# Patient Record
Sex: Female | Born: 1940
Health system: Southern US, Community
[De-identification: ages and names within clinical notes are randomized; demographics above are authoritative.]

## PROBLEM LIST (undated history)

## (undated) DIAGNOSIS — K219 Gastro-esophageal reflux disease without esophagitis: Secondary | ICD-10-CM

## (undated) DIAGNOSIS — E7800 Pure hypercholesterolemia, unspecified: Secondary | ICD-10-CM

## (undated) DIAGNOSIS — I639 Cerebral infarction, unspecified: Secondary | ICD-10-CM

## (undated) DIAGNOSIS — I10 Essential (primary) hypertension: Secondary | ICD-10-CM

## (undated) DIAGNOSIS — N390 Urinary tract infection, site not specified: Secondary | ICD-10-CM

## (undated) DIAGNOSIS — M8580 Other specified disorders of bone density and structure, unspecified site: Secondary | ICD-10-CM

## (undated) DIAGNOSIS — B019 Varicella without complication: Secondary | ICD-10-CM

## (undated) HISTORY — DX: Cerebral infarction, unspecified: I63.9

## (undated) HISTORY — DX: Varicella without complication: B01.9

## (undated) HISTORY — DX: Essential (primary) hypertension: I10

## (undated) HISTORY — DX: Gastro-esophageal reflux disease without esophagitis: K21.9

## (undated) HISTORY — PX: ABDOMINAL HYSTERECTOMY: SHX81

## (undated) HISTORY — DX: Urinary tract infection, site not specified: N39.0

## (undated) HISTORY — DX: Other specified disorders of bone density and structure, unspecified site: M85.80

## (undated) HISTORY — DX: Pure hypercholesterolemia, unspecified: E78.00

---

## 1974-05-04 HISTORY — PX: ABDOMINAL HYSTERECTOMY: SHX81

## 1984-05-04 HISTORY — PX: BREAST BIOPSY: SHX20

## 2007-03-28 ENCOUNTER — Ambulatory Visit: Payer: Self-pay | Admitting: Internal Medicine

## 2007-04-06 ENCOUNTER — Ambulatory Visit: Payer: Self-pay | Admitting: Family Medicine

## 2008-05-24 ENCOUNTER — Ambulatory Visit: Payer: Self-pay

## 2009-05-27 ENCOUNTER — Ambulatory Visit: Payer: Self-pay | Admitting: Internal Medicine

## 2009-10-10 ENCOUNTER — Ambulatory Visit: Payer: Self-pay | Admitting: Internal Medicine

## 2010-05-29 ENCOUNTER — Ambulatory Visit: Payer: Self-pay | Admitting: Unknown Physician Specialty

## 2011-06-02 ENCOUNTER — Ambulatory Visit: Payer: Self-pay

## 2014-04-05 ENCOUNTER — Ambulatory Visit: Payer: Self-pay | Admitting: Obstetrics and Gynecology

## 2015-06-11 ENCOUNTER — Other Ambulatory Visit: Payer: Self-pay | Admitting: Obstetrics and Gynecology

## 2015-06-11 DIAGNOSIS — Z1231 Encounter for screening mammogram for malignant neoplasm of breast: Secondary | ICD-10-CM

## 2015-06-13 ENCOUNTER — Ambulatory Visit
Admission: RE | Admit: 2015-06-13 | Discharge: 2015-06-13 | Disposition: A | Discharge status: Home / Self Care | Payer: Medicare Other | Source: Ambulatory Visit | Attending: Obstetrics and Gynecology | Admitting: Obstetrics and Gynecology

## 2015-06-13 DIAGNOSIS — Z1231 Encounter for screening mammogram for malignant neoplasm of breast: Secondary | ICD-10-CM | POA: Diagnosis not present

## 2015-06-17 ENCOUNTER — Other Ambulatory Visit: Payer: Self-pay | Admitting: Obstetrics and Gynecology

## 2015-06-17 DIAGNOSIS — R928 Other abnormal and inconclusive findings on diagnostic imaging of breast: Secondary | ICD-10-CM

## 2015-06-21 ENCOUNTER — Ambulatory Visit
Admission: RE | Admit: 2015-06-21 | Discharge: 2015-06-21 | Disposition: A | Discharge status: Home / Self Care | Payer: Medicare Other | Source: Ambulatory Visit | Attending: Obstetrics and Gynecology | Admitting: Obstetrics and Gynecology

## 2015-06-21 DIAGNOSIS — R928 Other abnormal and inconclusive findings on diagnostic imaging of breast: Secondary | ICD-10-CM

## 2016-07-07 ENCOUNTER — Other Ambulatory Visit: Payer: Self-pay | Admitting: Obstetrics and Gynecology

## 2016-07-07 DIAGNOSIS — Z1231 Encounter for screening mammogram for malignant neoplasm of breast: Secondary | ICD-10-CM

## 2016-07-15 ENCOUNTER — Ambulatory Visit
Admission: RE | Admit: 2016-07-15 | Discharge: 2016-07-15 | Disposition: A | Discharge status: Home / Self Care | Payer: Medicare Other | Source: Ambulatory Visit | Attending: Obstetrics and Gynecology | Admitting: Obstetrics and Gynecology

## 2016-07-15 DIAGNOSIS — Z1231 Encounter for screening mammogram for malignant neoplasm of breast: Secondary | ICD-10-CM | POA: Insufficient documentation

## 2017-08-11 ENCOUNTER — Ambulatory Visit (INDEPENDENT_AMBULATORY_CARE_PROVIDER_SITE_OTHER): Payer: Medicare Other

## 2017-08-11 ENCOUNTER — Encounter: Payer: Self-pay | Admitting: Podiatry

## 2017-08-11 ENCOUNTER — Ambulatory Visit (INDEPENDENT_AMBULATORY_CARE_PROVIDER_SITE_OTHER): Payer: Medicare Other | Admitting: Podiatry

## 2017-08-11 DIAGNOSIS — Q828 Other specified congenital malformations of skin: Secondary | ICD-10-CM

## 2017-08-11 DIAGNOSIS — M779 Enthesopathy, unspecified: Secondary | ICD-10-CM

## 2017-08-11 DIAGNOSIS — M778 Other enthesopathies, not elsewhere classified: Secondary | ICD-10-CM

## 2017-08-11 DIAGNOSIS — M7751 Other enthesopathy of right foot: Secondary | ICD-10-CM

## 2017-08-11 DIAGNOSIS — S90851A Superficial foreign body, right foot, initial encounter: Secondary | ICD-10-CM

## 2017-08-11 NOTE — Progress Notes (Signed)
  Subjective:  Patient ID: Alexa Ingram, female    DOB: 02/24/1941,  MRN: [id number] HPI Chief Complaint  Patient presents with  . Foot Pain    Lateral 5th MPJ right - callused area and tenderness x 1 week, thought she had something in foot, but doesn't remember stepping on anything, tried soaking and rubbing alcohol and vinegar on it-no help  . New Patient (Initial Visit)    77 y.o. female presents with the above complaint.   ROS: Denies fever chills nausea vomiting muscle aches pains chest pain shortness of breath calf pain headache  No past medical history on file. Past Surgical History:  Procedure Laterality Date  . ABDOMINAL HYSTERECTOMY    . BREAST BIOPSY Right 1986   neg   No current outpatient medications on file.  Allergies  Allergen Reactions  . Sulfa Antibiotics Itching  . Penicillin G Rash   Review of Systems Objective:  There were no vitals filed for this visit.  General: Well developed, nourished, in no acute distress, alert and oriented x3   Dermatological: Skin is warm, dry and supple bilateral. Nails x 10 are well maintained; remaining integument appears unremarkable at this time. There are no open sores, no preulcerative lesions, no rash or signs of infection present.  Reactive hyperkeratotic wound with porokeratotic lesion centrally located.  No foreign body visualized.  Vascular: Dorsalis Pedis artery and Posterior Tibial artery pedal pulses are 2/4 bilateral with immedate capillary fill time. Pedal hair growth present. No varicosities and no lower extremity edema present bilateral.   Neruologic: Grossly intact via light touch bilateral. Vibratory intact via tuning fork bilateral. Protective threshold with Semmes Wienstein monofilament intact to all pedal sites bilateral. Patellar and Achilles deep tendon reflexes 2+ bilateral. No Babinski or clonus noted bilateral.   Musculoskeletal: No gross boney pedal deformities bilateral. No pain, crepitus, or  limitation noted with foot and ankle range of motion bilateral. Muscular strength 5/5 in all groups tested bilateral.  Tailor's bunion deformity.  Bursitis and capsulitis is present fifth metatarsal phalangeal joint palpable.  Gait: Unassisted, Nonantalgic.    Radiographs:  Radiographs taken today demonstrate no acute findings  Assessment & Plan:   Assessment: Tailor's bunion deformity with capsulitis of the fifth metatarsal phalangeal joint and a punctated porokeratotic lesion right foot.    Plan: Injected 2 mg of dexamethasone after local anesthetic and sterile Betadine skin prep.  Tolerated procedure well.  Injected this to the fifth metatarsophalangeal joint plantarly.  I also debrided the reactive hyperkeratotic lesion that was present.  This alleviated the symptoms obviously with the local anesthetic should this recur she will notify us immediately.     Max T. [place] AllenHyatt, [place] Dakota[place]PM

## 2017-08-13 ENCOUNTER — Ambulatory Visit: Payer: Self-pay | Admitting: Podiatry

## 2018-01-08 ENCOUNTER — Ambulatory Visit
Admission: EM | Admit: 2018-01-08 | Discharge: 2018-01-08 | Disposition: A | Discharge status: Home / Self Care | Payer: Medicare Other | Attending: Family Medicine | Admitting: Family Medicine

## 2018-01-08 DIAGNOSIS — H00011 Hordeolum externum right upper eyelid: Secondary | ICD-10-CM

## 2018-01-08 DIAGNOSIS — I160 Hypertensive urgency: Secondary | ICD-10-CM | POA: Diagnosis not present

## 2018-01-08 MED ORDER — AMLODIPINE BESYLATE 10 MG PO TABS: 10.0000 mg | ORAL_TABLET | Freq: Every day | ORAL | 1 refills | Status: DC

## 2018-01-08 MED ORDER — POLYMYXIN B-TRIMETHOPRIM 10000-0.1 UNIT/ML-% OP SOLN: 1.0000 [drp] | Freq: Four times a day (QID) | OPHTHALMIC | 0 refills | Status: AC

## 2018-01-08 NOTE — Discharge Instructions (Signed)
Warm compresses frequently.  Antibiotic eye drop.  BP medication as prescribed.  Please follow up with a primary care provider.  Check BP daily.  Dr. Adriana Simas

## 2018-01-08 NOTE — ED Triage Notes (Signed)
Pt here for stye on her right eyelid for 2 days, has been doing the warm compresses. And still no improvement. Does have a slight pain in the ride corner of her eye.

## 2018-01-08 NOTE — ED Provider Notes (Signed)
[facility]    CSN: [id number] Arrival date & time: 01/08/18  1350  History   Chief Complaint Chief Complaint  Patient presents with  . Stye   HPI  77 year old female presents with the above complaint.  Patient reports a 2-day history of right upper eyelid swelling.  Mild pain.  Has been doing warm compresses without resolution.  No redness or drainage from the eye.  No vision changes.  No known inciting event.  No known exacerbating factors.  No other complaints.  Of note, patient is not taking her blood pressure medication.  Her blood pressure is markedly elevated today.  PMH, Surgical Hx, Family Hx, Social History reviewed and updated as below.  PMH: HTN  Past Surgical History:  Procedure Laterality Date  . ABDOMINAL HYSTERECTOMY    . BREAST BIOPSY Right 1986   neg    OB History   None    Home Medications    Prior to Admission medications   Medication Sig Start Date End Date Taking? Authorizing Provider  amLODipine (NORVASC) 10 MG tablet Take 1 tablet (10 mg total) by mouth daily. 01/08/18   Tommie Sams, DO  trimethoprim-polymyxin b (POLYTRIM) ophthalmic solution Place 1 drop into the right eye every 6 (six) hours for 5 days. 01/08/18 01/13/18  Tommie Sams, DO    Family History Family History  Problem Relation Age of Onset  . Breast cancer Sister 32  . Breast cancer Paternal Aunt 52    Social History Social History   Tobacco Use  . Smoking status: Unknown If Ever Smoked  . Smokeless tobacco: Never Used  Substance Use Topics  . Alcohol use: Not on file  . Drug use: Not on file     Allergies   Sulfa antibiotics and Penicillin g   Review of Systems Review of Systems  Eyes: Positive for pain.  Respiratory: Negative.   Cardiovascular: Negative.    Physical Exam Triage Vital Signs ED Triage Vitals  Enc Vitals Group     BP 01/08/18 1405 (!) 260/110     Pulse Rate 01/08/18 1405 87     Resp 01/08/18 1405 18     Temp 01/08/18 1405 98.3  F (36.8 C)     Temp Source 01/08/18 1405 Oral     SpO2 01/08/18 1405 98 %     Weight 01/08/18 1412 114 lb (51.7 kg)     Height --      Head Circumference --      Peak Flow --      Pain Score 01/08/18 1411 5     Pain Loc --      Pain Edu? --      Excl. in GC? --    Updated Vital Signs BP (!) 230/120 (BP Location: Left Arm)   Pulse 87   Temp 98.3 F (36.8 C) (Oral)   Resp 18   Wt 51.7 kg   SpO2 98%   Visual Acuity Right Eye Distance:   Left Eye Distance:   Bilateral Distance:    Right Eye Near:   Left Eye Near:    Bilateral Near:     Physical Exam  Constitutional: She is oriented to person, place, and time. She appears well-developed. No distress.  Eyes:  Right upper eyelid redness and swelling.  Normal conjunctiva.  Cardiovascular: Normal rate and regular rhythm.  Pulmonary/Chest: Effort normal. She has no wheezes. She has no rales.  Neurological: She is alert and oriented to person, place, and time.  Psychiatric: She has a normal mood and affect. Her behavior is normal.  Nursing note and vitals reviewed.  UC Treatments / Results  Labs (all labs ordered are listed, but only abnormal results are displayed) Labs Reviewed - No data to display  EKG None  Radiology No results found.  Procedures Procedures (including critical care time)  Medications Ordered in UC Medications - No data to display  Initial Impression / Assessment and Plan / UC Course  I have reviewed the triage vital signs and the nursing notes.  Pertinent labs & imaging results that were available during my care of the patient were reviewed by me and considered in my medical decision making (see chart for details).    77 year old female presents with a stye.  Patient also has hypertensive urgency.  Restarting amlodipine.  Warm compresses and Polytrim for her stye.  Final Clinical Impressions(s) / UC Diagnoses   Final diagnoses:  Hordeolum externum of right upper eyelid  Hypertensive  urgency     Discharge Instructions     Warm compresses frequently.  Antibiotic eye drop.  BP medication as prescribed.  Please follow up with a primary care provider.  Check BP daily.  Dr. Adriana Simas     ED Prescriptions    Medication Sig Dispense Auth. Provider   amLODipine (NORVASC) 10 MG tablet Take 1 tablet (10 mg total) by mouth daily. 90 tablet Marca Gadsby, [place] G, DO   trimethoprim-polymyxin b (POLYTRIM) ophthalmic solution Place 1 drop into the right eye every 6 (six) hours for 5 days. 10 mL Tommie Sams, DO     Controlled Substance Prescriptions [place] Controlled Substance Registry consulted? Not Applicable   Tommie Sams, DO 01/08/18 1636

## 2018-01-09 ENCOUNTER — Telehealth: Payer: Self-pay

## 2018-01-09 NOTE — Telephone Encounter (Signed)
Pt to urgent care with eye drops from Dr. Adriana Simas yesterday. She is afraid to take it due to allergy to sulfa. Ovid Curd prescribed Erythromycin ointment to replace the drops. I called prescription to [facility] and instructed patient.

## 2018-04-14 ENCOUNTER — Other Ambulatory Visit: Payer: Self-pay

## 2018-04-14 ENCOUNTER — Ambulatory Visit (INDEPENDENT_AMBULATORY_CARE_PROVIDER_SITE_OTHER): Payer: Medicare Other | Admitting: Nurse Practitioner

## 2018-04-14 ENCOUNTER — Encounter: Payer: Self-pay | Admitting: Nurse Practitioner

## 2018-04-14 VITALS — BP 135/82 | HR 100 | Temp 97.7°F | Ht <= 58 in | Wt 114.2 lb

## 2018-04-14 DIAGNOSIS — E785 Hyperlipidemia, unspecified: Secondary | ICD-10-CM

## 2018-04-14 DIAGNOSIS — Z7689 Persons encountering health services in other specified circumstances: Secondary | ICD-10-CM

## 2018-04-14 DIAGNOSIS — R0989 Other specified symptoms and signs involving the circulatory and respiratory systems: Secondary | ICD-10-CM | POA: Diagnosis not present

## 2018-04-14 DIAGNOSIS — I10 Essential (primary) hypertension: Secondary | ICD-10-CM | POA: Diagnosis not present

## 2018-04-14 MED ORDER — HYDROCHLOROTHIAZIDE 12.5 MG PO TABS: 12.5000 mg | ORAL_TABLET | Freq: Every day | ORAL | 3 refills | Status: DC

## 2018-04-14 MED ORDER — HYDROCHLOROTHIAZIDE 25 MG PO TABS: 25.0000 mg | ORAL_TABLET | Freq: Every day | ORAL | 2 refills | Status: DC

## 2018-04-14 NOTE — Progress Notes (Signed)
Subjective:    Patient ID: Alexa Ingram, female    DOB: [birth year], 77 y.o.   MRN: [id number]  Alexa Ingram is a 77 y.o. female presenting on 04/14/2018 for Establish Care (irregular blood pressure, pt notice some bilateral ankle swelling since taking Amlodipine )   HPI Establish Care New Provider Pt last seen by PCP Dr. Judie Grieve at [facility] in 2017-2018 and previously with Dr. Yves Dill.  Obtain records.  Hypertension Recently started amlodipine and has new leg swelling.  Has been on multiple medications for hypertension in past that cause side effects: Has been on carvedilol (red rash), valsartan (made her itch) in past.  Patient reports one medication caused abdominal swelling, but was not  Tekturna-aliskiren (unknown). - Lovaza (unknown) - VitD3 (unknown)  Social History   Tobacco Use  . Smoking status: Former Smoker    Last attempt to quit: 04/15/2003    Years since quitting: 15.0  . Smokeless tobacco: Never Used  . Tobacco comment: smoked socially most often for about 3-4 years  Substance Use Topics  . Alcohol use: Never    Frequency: Never  . Drug use: Never    Review of Systems Per HPI unless specifically indicated above     Objective:    BP 135/82 (BP Location: Left Arm, Patient Position: Sitting, Cuff Size: Normal)   Pulse 100   Temp 97.7 F (36.5 C) (Oral)   Ht 4' 8.5" (1.435 m)   Wt 114 lb 3.2 oz (51.8 kg)   BMI 25.15 kg/m   Wt Readings from Last 3 Encounters:  04/14/18 114 lb 3.2 oz (51.8 kg)  01/08/18 114 lb (51.7 kg)    Physical Exam Vitals signs reviewed.  Constitutional:      General: She is not in acute distress.    Appearance: She is well-developed.  HENT:     Head: Normocephalic and atraumatic.  Cardiovascular:     Rate and Rhythm: Normal rate and regular rhythm.     Pulses:          Carotid pulses are on the left side with bruit.    Heart sounds: Normal heart sounds, S1 normal and S2 normal.   Comments: JVD with hepatojugular reflux Pulmonary:     Effort: Pulmonary effort is normal. No respiratory distress.     Breath sounds: Normal breath sounds.  Skin:    General: Skin is warm and dry.  Neurological:     Mental Status: She is alert and oriented to person, place, and time.  Psychiatric:        Behavior: Behavior normal.       Assessment & Plan:   Problem List Items Addressed This Visit    None    Visit Diagnoses    Hyperlipidemia, unspecified hyperlipidemia type    -  Primary Patient with known history of hyperlipidemia.  NO recent labs and not currently on medications.  New LEFT carotid bruit 2/6 today on exam.   - Labs fasting in next 7 days   - likely will need to start statin therapy - US duplex to eval bruit. - Follow-up 3 months.   Relevant Medications   hydrochlorothiazide (HYDRODIURIL) 25 MG tablet   Other Relevant Orders   Comprehensive metabolic panel   Lipid panel   US Carotid Duplex Bilateral   Encounter to establish care     Previous PCP visit was > 6 months ago.  Recent urgent care visit with BP med management.  Records will be requested.  Past medical, family, and surgical history reviewed w/ pt.     Essential hypertension     Stable today, but patient with symptoms as side effects of amlodipine.   Plan: 1. Labs today 2. STOP amlodipine 3. START HCTZ 25 mg once daily 4. Follow-up 6 weeks.   Relevant Medications   hydrochlorothiazide (HYDRODIURIL) 25 MG tablet   Other Relevant Orders   Comprehensive metabolic panel   Bruit of left carotid artery     See AP hyperlipidemia above.   Relevant Orders   US Carotid Duplex Bilateral      Meds ordered this encounter  Medications  . DISCONTD: hydrochlorothiazide (HYDRODIURIL) 12.5 MG tablet    Sig: Take 1 tablet (12.5 mg total) by mouth daily.    Dispense:  90 tablet    Refill:  3    Order Specific Question:   Supervising Provider    Answer:   Smitty CordsKARAMALEGOS, ALEXANDER J [[id number]]  .  hydrochlorothiazide (HYDRODIURIL) 25 MG tablet    Sig: Take 1 tablet (25 mg total) by mouth daily.    Dispense:  30 tablet    Refill:  2    NOTE DOSE - PATIENT needs to take 25 mg once daily not 12.5 mg once daily.    Order Specific Question:   Supervising Provider    Answer:   Smitty CordsKARAMALEGOS, ALEXANDER J [[id number]]    Follow up plan: Return in about 6 weeks (around 05/26/2018) for hypertension.  Wilhelmina McardleLauren Lynda Capistran, DNP, AGPCNP-BC Adult Gerontology Primary Care Nurse Practitioner [facility] 04/14/2018, 3:41 PM

## 2018-04-14 NOTE — Patient Instructions (Addendum)
Alexa Ingram,   Thank you for coming in to clinic today.  1. STOP amlodipine   2. START hydrochlorothiazide 25 mg once daily - This will make you go to the bathroom more frequently to urinate. - Take this in the mornings.  Please schedule a follow-up appointment with [name], AGNP. Return in about 6 weeks (around 05/26/2018) for hypertension.  If you have any other questions or concerns, please feel free to call the clinic or send a message through MyChart. You may also schedule an earlier appointment if necessary.  You will receive a survey after today's visit either digitally by e-mail or paper by [employer] mail. Your experiences and feedback matter to us.  Please respond so we know how we are doing as we provide care for you.   [name], DNP, AGNP-BC Adult Gerontology Nurse Practitioner [facility]

## 2018-04-17 ENCOUNTER — Encounter: Payer: Self-pay | Admitting: Nurse Practitioner

## 2018-04-18 DIAGNOSIS — I10 Essential (primary) hypertension: Secondary | ICD-10-CM | POA: Diagnosis not present

## 2018-04-18 DIAGNOSIS — E785 Hyperlipidemia, unspecified: Secondary | ICD-10-CM | POA: Diagnosis not present

## 2018-04-19 ENCOUNTER — Encounter: Payer: Self-pay | Admitting: Nurse Practitioner

## 2018-04-19 DIAGNOSIS — I10 Essential (primary) hypertension: Secondary | ICD-10-CM | POA: Insufficient documentation

## 2018-04-19 DIAGNOSIS — M8580 Other specified disorders of bone density and structure, unspecified site: Secondary | ICD-10-CM | POA: Insufficient documentation

## 2018-04-19 DIAGNOSIS — E7800 Pure hypercholesterolemia, unspecified: Secondary | ICD-10-CM | POA: Insufficient documentation

## 2018-04-19 LAB — COMPREHENSIVE METABOLIC PANEL
ALT: 18 IU/L (ref 0–32)
AST: 18 IU/L (ref 0–40)
Albumin/Globulin Ratio: 1.9 (ref 1.2–2.2)
Albumin: 4.7 g/dL (ref 3.5–4.8)
Alkaline Phosphatase: 108 IU/L (ref 39–117)
BUN/Creatinine Ratio: 15 (ref 12–28)
BUN: 15 mg/dL (ref 8–27)
Bilirubin Total: 0.6 mg/dL (ref 0.0–1.2)
CO2: 23 mmol/L (ref 20–29)
Calcium: 9.7 mg/dL (ref 8.7–10.3)
Chloride: 106 mmol/L (ref 96–106)
Creatinine, Ser: 0.97 mg/dL (ref 0.57–1.00)
GFR calc Af Amer: 64 mL/min/{1.73_m2} (ref >59)
GFR calc non Af Amer: 56 mL/min/{1.73_m2} — ABNORMAL LOW (ref >59)
Globulin, Total: 2.5 g/dL (ref 1.5–4.5)
Glucose: 100 mg/dL — ABNORMAL HIGH (ref 65–99)
Potassium: 3.8 mmol/L (ref 3.5–5.2)
Sodium: 144 mmol/L (ref 134–144)
Total Protein: 7.2 g/dL (ref 6.0–8.5)

## 2018-04-19 LAB — LIPID PANEL
Chol/HDL Ratio: 4 ratio (ref 0.0–4.4)
Cholesterol, Total: 290 mg/dL — ABNORMAL HIGH (ref 100–199)
HDL: 73 mg/dL (ref >39)
LDL Calculated: 192 mg/dL — ABNORMAL HIGH (ref 0–99)
Triglycerides: 127 mg/dL (ref 0–149)
VLDL Cholesterol Cal: 25 mg/dL (ref 5–40)

## 2018-05-21 ENCOUNTER — Emergency Department
Admission: EM | Admit: 2018-05-21 | Discharge: 2018-05-21 | Disposition: A | Discharge status: Home / Self Care | Payer: Medicare Other | Attending: Emergency Medicine | Admitting: Emergency Medicine

## 2018-05-21 ENCOUNTER — Emergency Department: Payer: Medicare Other

## 2018-05-21 ENCOUNTER — Other Ambulatory Visit: Payer: Self-pay

## 2018-05-21 ENCOUNTER — Encounter: Payer: Self-pay | Admitting: Emergency Medicine

## 2018-05-21 DIAGNOSIS — Y998 Other external cause status: Secondary | ICD-10-CM | POA: Insufficient documentation

## 2018-05-21 DIAGNOSIS — Y9389 Activity, other specified: Secondary | ICD-10-CM | POA: Diagnosis not present

## 2018-05-21 DIAGNOSIS — I10 Essential (primary) hypertension: Secondary | ICD-10-CM | POA: Insufficient documentation

## 2018-05-21 DIAGNOSIS — Z79899 Other long term (current) drug therapy: Secondary | ICD-10-CM | POA: Diagnosis not present

## 2018-05-21 DIAGNOSIS — X58XXXA Exposure to other specified factors, initial encounter: Secondary | ICD-10-CM | POA: Insufficient documentation

## 2018-05-21 DIAGNOSIS — E876 Hypokalemia: Secondary | ICD-10-CM | POA: Diagnosis not present

## 2018-05-21 DIAGNOSIS — Y929 Unspecified place or not applicable: Secondary | ICD-10-CM | POA: Insufficient documentation

## 2018-05-21 DIAGNOSIS — S199XXA Unspecified injury of neck, initial encounter: Secondary | ICD-10-CM | POA: Diagnosis present

## 2018-05-21 DIAGNOSIS — R51 Headache: Secondary | ICD-10-CM | POA: Diagnosis not present

## 2018-05-21 DIAGNOSIS — Z87891 Personal history of nicotine dependence: Secondary | ICD-10-CM | POA: Insufficient documentation

## 2018-05-21 DIAGNOSIS — R000 Tachycardia, unspecified: Secondary | ICD-10-CM | POA: Diagnosis not present

## 2018-05-21 DIAGNOSIS — S161XXA Strain of muscle, fascia and tendon at neck level, initial encounter: Secondary | ICD-10-CM

## 2018-05-21 DIAGNOSIS — I6601 Occlusion and stenosis of right middle cerebral artery: Secondary | ICD-10-CM | POA: Diagnosis not present

## 2018-05-21 DIAGNOSIS — I6523 Occlusion and stenosis of bilateral carotid arteries: Secondary | ICD-10-CM | POA: Diagnosis not present

## 2018-05-21 LAB — CBC
HCT: 45.3 % (ref 36.0–46.0)
Hemoglobin: 15.6 g/dL — ABNORMAL HIGH (ref 12.0–15.0)
MCH: 30.5 pg (ref 26.0–34.0)
MCHC: 34.4 g/dL (ref 30.0–36.0)
MCV: 88.5 fL (ref 80.0–100.0)
Platelets: 261 10*3/uL (ref 150–400)
RBC: 5.12 MIL/uL — ABNORMAL HIGH (ref 3.87–5.11)
RDW: 12.5 % (ref 11.5–15.5)
WBC: 12.8 10*3/uL — ABNORMAL HIGH (ref 4.0–10.5)
nRBC: 0 % (ref 0.0–0.2)

## 2018-05-21 LAB — TROPONIN I: Troponin I: <0.03 ng/mL (ref <0.03)

## 2018-05-21 LAB — BASIC METABOLIC PANEL
Anion gap: 9 (ref 5–15)
BUN: 16 mg/dL (ref 8–23)
CO2: 31 mmol/L (ref 22–32)
Calcium: 9.7 mg/dL (ref 8.9–10.3)
Chloride: 98 mmol/L (ref 98–111)
Creatinine, Ser: 1.17 mg/dL — ABNORMAL HIGH (ref 0.44–1.00)
GFR calc Af Amer: 51 mL/min — ABNORMAL LOW (ref >60)
GFR calc non Af Amer: 44 mL/min — ABNORMAL LOW (ref >60)
Glucose, Bld: 157 mg/dL — ABNORMAL HIGH (ref 70–99)
Potassium: 2.7 mmol/L — CL (ref 3.5–5.1)
Sodium: 138 mmol/L (ref 135–145)

## 2018-05-21 MED ORDER — ACETAMINOPHEN 325 MG PO TABS
650.0000 mg | ORAL_TABLET | Freq: Once | ORAL | Status: AC
  Administered 2018-05-21: 650 mg via ORAL
  Filled 2018-05-21: qty 2

## 2018-05-21 MED ORDER — SODIUM CHLORIDE 0.9 % IV BOLUS
500.0000 mL | Freq: Once | INTRAVENOUS | Status: AC
  Administered 2018-05-21: 500 mL via INTRAVENOUS

## 2018-05-21 MED ORDER — AMLODIPINE BESYLATE 5 MG PO TABS
10.0000 mg | ORAL_TABLET | Freq: Once | ORAL | Status: AC
  Administered 2018-05-21: 10 mg via ORAL
  Filled 2018-05-21: qty 2

## 2018-05-21 MED ORDER — POTASSIUM CHLORIDE 20 MEQ/15ML (10%) PO SOLN
40.0000 meq | Freq: Once | ORAL | Status: AC
  Administered 2018-05-21: 40 meq via ORAL
  Filled 2018-05-21: qty 30

## 2018-05-21 MED ORDER — LABETALOL HCL 100 MG PO TABS
100.0000 mg | ORAL_TABLET | ORAL | Status: AC
  Administered 2018-05-21: 100 mg via ORAL
  Filled 2018-05-21: qty 1

## 2018-05-21 MED ORDER — AMLODIPINE BESYLATE 2.5 MG PO TABS: 2.5000 mg | ORAL_TABLET | Freq: Every day | ORAL | 0 refills | Status: DC

## 2018-05-21 MED ORDER — IOHEXOL 350 MG/ML SOLN
75.0000 mL | Freq: Once | INTRAVENOUS | Status: AC | PRN
  Administered 2018-05-21: 60 mL via INTRAVENOUS

## 2018-05-21 MED ORDER — IBUPROFEN 400 MG PO TABS
400.0000 mg | ORAL_TABLET | ORAL | Status: AC
  Administered 2018-05-21: 400 mg via ORAL
  Filled 2018-05-21: qty 1

## 2018-05-21 NOTE — ED Notes (Signed)
Pt ambulated without difficulty or assistance

## 2018-05-21 NOTE — ED Notes (Signed)
MD notified of BP at this time.

## 2018-05-21 NOTE — Discharge Instructions (Signed)
As we have discussed, please follow up with your primary care doctor as soon as possible regarding todays Emergency Department (ED) visit and your neck pain, high blood pressure, and noted low potassium.    Call your doctor or return to the ED if you have a worsening neck pain or headache, sudden and severe headache, confusion, slurred speech, facial droop, weakness or numbness in any arm or leg, extreme fatigue, vision problems, or other symptoms that concern you.

## 2018-05-21 NOTE — ED Triage Notes (Signed)
Pt to ED via POV c/o head and neck pain that started last night. Pt states that she was lying down when the pain started. Pt states that prior to that she had dropped her keys in the car between the seats and was struggling to get them out. Pt states that the pain is worse when she tries to turn her head. Pt denies any neuro symptoms. Pt is in NAD at this time.

## 2018-05-21 NOTE — ED Notes (Signed)
Patient transported to CT 

## 2018-05-21 NOTE — ED Provider Notes (Signed)
[facility] Emergency Department Provider Note   ____________________________________________   First MD Initiated Contact with Patient 05/21/18 [phone]     (approximate)  I have reviewed the triage vital signs and the nursing notes.   HISTORY  Chief Complaint Headache and Neck Pain    HPI Alexa AlimentKay Elizabeth Haverstock is a 78 y.o. female here for evaluation of neck pain  Patient reports last night she began having pain over the back right upper portion of her neck.  She points to the back of her neck, points just lateral to about C1-C2 region.  No injury.  She reports that hurt all night and kept her from sleeping.  Is associated with a headache that radiates out from that point over the back portion of the scalp.  No weakness numbness tingling nausea or vomiting.  No fevers or chills.  No chest pain or trouble breathing.  Reports he is otherwise in good health.  The pain started as discomfort when she had bent forward to pick up her car keys that had fallen between 2 seats, but it seems to be worsening.  She does not take any medication for it.  Patient is agreeable to trying Tylenol, she does not wish for anything stronger at this time, does not wish for anything like ibuprofen or anything stronger like morphine either.  She would just like to try Tylenol.  She does have high blood pressure, was previously on amlodipine but changed to hydrochlorothiazide as they thought she did not quite need as high dosing.   Past Medical History:  Diagnosis Date  . Hypertension   . Osteopenia   . Pure hypercholesterolemia     Patient Active Problem List   Diagnosis Date Noted  . Hypertension   . Osteopenia   . Pure hypercholesterolemia     Past Surgical History:  Procedure Laterality Date  . ABDOMINAL HYSTERECTOMY     ovaries remain  . BREAST BIOPSY Right 1986   neg    Prior to Admission medications   Medication Sig Start Date End Date Taking? Authorizing Provider    hydrochlorothiazide (HYDRODIURIL) 25 MG tablet Take 1 tablet (25 mg total) by mouth daily. 04/14/18  Yes Galen ManilaKennedy, Lauren Renee, NP  vitamin C (ASCORBIC ACID) 500 MG tablet Take 500 mg by mouth daily.   Yes [provider]  amLODipine (NORVASC) 2.5 MG tablet Take 1 tablet (2.5 mg total) by mouth daily. 05/21/18 05/21/19  Sharyn CreamerQuale, Mark, MD    Allergies Sulfa antibiotics; Sulfate; and Penicillin g  Family History  Problem Relation Age of Onset  . Breast cancer Sister 5160  . Stroke Sister   . Breast cancer Paternal Aunt 8990  . Stroke Mother   . Hypertension Mother   . Healthy Daughter   . Healthy Son     Social History Social History   Tobacco Use  . Smoking status: Former Smoker    Last attempt to quit: 04/15/2003    Years since quitting: 15.1  . Smokeless tobacco: Never Used  . Tobacco comment: smoked socially most often for about 3-4 years  Substance Use Topics  . Alcohol use: Never    Frequency: Never  . Drug use: Never    Review of Systems Constitutional: No fever/chills Eyes: No visual changes. ENT: No sore throat.  See HPI.  Does report that she has some known blockages in her neck previously. Cardiovascular: Denies chest pain. Respiratory: Denies shortness of breath. Gastrointestinal: No abdominal pain.   Genitourinary: Negative for  dysuria. Musculoskeletal: Negative for back pain. Skin: Negative for rash. Neurological: Negative for areas of focal weakness or numbness.  No trouble with vision.  No double vision.  The headache started last night, slowly worsened throughout the night.  Was not worst and sudden and abrupt in onset.    ____________________________________________   PHYSICAL EXAM:  VITAL SIGNS: ED Triage Vitals  Enc Vitals Group     BP 05/21/18 0857 (!) 194/98     Pulse Rate 05/21/18 0857 (!) 127     Resp 05/21/18 0857 16     Temp 05/21/18 0857 98.2 F (36.8 C)     Temp Source 05/21/18 0857 Oral     SpO2 05/21/18 0857 100 %      Weight 05/21/18 0858 114 lb (51.7 kg)     Height 05/21/18 0858 4\' 8"  (1.422 m)     Head Circumference --      Peak Flow --      Pain Score 05/21/18 0858 9     Pain Loc --      Pain Edu? --      Excl. in GC? --     Constitutional: Alert and oriented. Well appearing and in no acute distress. Eyes: Conjunctivae are normal. Head: Atraumatic. Nose: No congestion/rhinnorhea. Mouth/Throat: Mucous membranes are moist. Neck: No stridor.  Denies anterior neck pain. Patient able to move her head left and right through full range of motion, but reports pain in the back right upper portion of the cervical paraspinous region especially with turning head to the right.  There is no overlying mass abscess or induration in the area. Cardiovascular: Normal rate, regular rhythm. Grossly normal heart sounds.  Good peripheral circulation. Respiratory: Normal respiratory effort.  No retractions. Lungs CTAB. Gastrointestinal: Soft and nontender. No distention. Musculoskeletal: No lower extremity tenderness nor edema. Neurologic:  Normal speech and language. No gross focal neurologic deficits are appreciated.  Cranial nerve exam is normal. Skin:  Skin is warm, dry and intact. No rash noted. Psychiatric: Mood and affect are normal. Speech and behavior are normal.  ____________________________________________   LABS (all labs ordered are listed, but only abnormal results are displayed)  Labs Reviewed  CBC - Abnormal; Notable for the following components:      Result Value   WBC 12.8 (*)    RBC 5.12 (*)    Hemoglobin 15.6 (*)    All other components within normal limits  BASIC METABOLIC PANEL - Abnormal; Notable for the following components:   Potassium 2.7 (*)    Glucose, Bld 157 (*)    Creatinine, Ser 1.17 (*)    GFR calc non Af Amer 44 (*)    GFR calc Af Amer 51 (*)    All other components within normal limits  TROPONIN I   ____________________________________________  EKG  Reviewed by me at  9:30 AM Heart rate 129 QRS 95 QTc 440 Some slight motion artifact, sinus tachycardia, nonspecific T wave abnormality, no ST elevation.  May be rate related. ____________________________________________  RADIOLOGY  Ct Angio Head W Or Wo Contrast  Result Date: 05/21/2018 CLINICAL DATA:  Right upper cervical neck pain. Rule out dissection. Headache. EXAM: CT ANGIOGRAPHY HEAD AND NECK TECHNIQUE: Multidetector CT imaging of the head and neck was performed using the standard protocol during bolus administration of intravenous contrast. Multiplanar CT image reconstructions and MIPs were obtained to evaluate the vascular anatomy. Carotid stenosis measurements (when applicable) are obtained utilizing NASCET criteria, using the distal internal carotid diameter as the  denominator. CONTRAST:  60mL OMNIPAQUE IOHEXOL 350 MG/ML SOLN COMPARISON:  None. FINDINGS: CT HEAD FINDINGS Brain: No evidence of acute infarction, hemorrhage, hydrocephalus, extra-axial collection or mass lesion/mass effect. Vascular: Negative for hyperdense vessel Skull: Negative Sinuses: Negative Orbits: Negative Review of the MIP images confirms the above findings CTA NECK FINDINGS Aortic arch: Mild atherosclerotic calcification aortic arch and proximal great vessels. Right carotid system: Mild atherosclerotic calcification right common carotid artery. Calcified plaque proximal right internal carotid artery narrowing the lumen by 65% diameter stenosis. Left carotid system: Mild atherosclerotic disease left common carotid artery. Atherosclerotic calcification left carotid bifurcation. 25% diameter stenosis left internal carotid artery. Vertebral arteries: Small vertebral arteries bilaterally. Negative for stenosis or dissection. Skeleton: No acute abnormality. Other neck: Negative Upper chest: Negative Review of the MIP images confirms the above findings CTA HEAD FINDINGS Anterior circulation: Cavernous carotid widely patent bilaterally with minimal  atherosclerotic disease. Anterior and middle cerebral arteries patent bilaterally. Mild stenosis right MCA bifurcation. Posterior circulation: Fetal origin of the posterior cerebral arteries with hypoplastic vertebral arteries and basilar artery. Left vertebral artery ends in PICA. PICA patent bilaterally. Basilar patent. Posterior cerebral arteries patent bilaterally without stenosis. Venous sinuses: Normal enhancement. Anatomic variants: None Delayed phase: Normal enhancement postcontrast administration. Review of the MIP images confirms the above findings IMPRESSION: 1. Negative for carotid or vertebral dissection 2. 65% diameter stenosis proximal right internal carotid artery. 25% diameter stenosis proximal left internal carotid artery 3. Mild stenosis right MCA bifurcation 4. Negative for emergent large vessel occlusion. Electronically Signed   By: Marlan Palauharles  Clark M.D.   On: 05/21/2018 10:44   Ct Angio Neck W And/or Wo Contrast  Result Date: 05/21/2018 CLINICAL DATA:  Right upper cervical neck pain. Rule out dissection. Headache. EXAM: CT ANGIOGRAPHY HEAD AND NECK TECHNIQUE: Multidetector CT imaging of the head and neck was performed using the standard protocol during bolus administration of intravenous contrast. Multiplanar CT image reconstructions and MIPs were obtained to evaluate the vascular anatomy. Carotid stenosis measurements (when applicable) are obtained utilizing NASCET criteria, using the distal internal carotid diameter as the denominator. CONTRAST:  60mL OMNIPAQUE IOHEXOL 350 MG/ML SOLN COMPARISON:  None. FINDINGS: CT HEAD FINDINGS Brain: No evidence of acute infarction, hemorrhage, hydrocephalus, extra-axial collection or mass lesion/mass effect. Vascular: Negative for hyperdense vessel Skull: Negative Sinuses: Negative Orbits: Negative Review of the MIP images confirms the above findings CTA NECK FINDINGS Aortic arch: Mild atherosclerotic calcification aortic arch and proximal great vessels.  Right carotid system: Mild atherosclerotic calcification right common carotid artery. Calcified plaque proximal right internal carotid artery narrowing the lumen by 65% diameter stenosis. Left carotid system: Mild atherosclerotic disease left common carotid artery. Atherosclerotic calcification left carotid bifurcation. 25% diameter stenosis left internal carotid artery. Vertebral arteries: Small vertebral arteries bilaterally. Negative for stenosis or dissection. Skeleton: No acute abnormality. Other neck: Negative Upper chest: Negative Review of the MIP images confirms the above findings CTA HEAD FINDINGS Anterior circulation: Cavernous carotid widely patent bilaterally with minimal atherosclerotic disease. Anterior and middle cerebral arteries patent bilaterally. Mild stenosis right MCA bifurcation. Posterior circulation: Fetal origin of the posterior cerebral arteries with hypoplastic vertebral arteries and basilar artery. Left vertebral artery ends in PICA. PICA patent bilaterally. Basilar patent. Posterior cerebral arteries patent bilaterally without stenosis. Venous sinuses: Normal enhancement. Anatomic variants: None Delayed phase: Normal enhancement postcontrast administration. Review of the MIP images confirms the above findings IMPRESSION: 1. Negative for carotid or vertebral dissection 2. 65% diameter stenosis proximal right internal carotid artery. 25% diameter  stenosis proximal left internal carotid artery 3. Mild stenosis right MCA bifurcation 4. Negative for emergent large vessel occlusion. Electronically Signed   By: Marlan Palau M.D.   On: 05/21/2018 10:44    CT results reviewed by me.  Discussed narrowing in the carotid artery with the patient, she reports she is already aware and will discuss with her primary care doctor as well. ____________________________________________   PROCEDURES  Procedure(s) performed: None  Procedures  Critical Care performed:  No  ____________________________________________   INITIAL IMPRESSION / ASSESSMENT AND PLAN / ED COURSE  Pertinent labs & imaging results that were available during my care of the patient were reviewed by me and considered in my medical decision making (see chart for details).   Differential diagnosis includes but is not exclusive to subarachnoid hemorrhage, meningitis, encephalitis, previous head trauma, cavernous venous thrombosis, muscle tension headache, temporal arteritis, migraine or migraine equivalent, etc. evaluation, seems her pain is mostly limited to the cervical spine region, but also radiates up into the occipital region of the head.  I suspect this may be musculoskeletal in nature as there is point tenderness in the paraspinous muscle regions of the right cervical spine region.  No fall or injury.  Started after bending forward to get car keys.  Given the patient's clinical history, presentation no notable hypertension on arrival which may or may not be related to her pain will obtain CT angiogram to further evaluate especially exclude dissection, intracranial hemorrhage, or alternatively it appears to be very low risk presentation for aneurysm.  No cardiac or pulmonary symptoms.  Normal and very reassuring neurologic examination.   Clinical Course as of May 22 1415  Sat May 21, 2018  1034 Notable ongoing severe hypertension.  I have given Norvasc, no improvement.  Will trial oral labetalol at this time.   [MQ]  1145 Patient reports she is feeling better.  Pain is improved, just very mild right now.  Reports the Tylenol helped.  She is resting comfortably, fully alert and oriented.  CT scan reassuring, patient reports she is already aware of stenosis in the right carotid and she follows with her primary care doctor for that.  Her blood pressure has improved markedly, and came to continue to observe her and give her a small fluid bolus as I did not anticipate her blood pressure  dropping so rapidly.  She is fully alert and oriented without complaint other than slight neck discomfort at this time.  Rule replete potassium, takes hydrochlorothiazide which is likely causation for that.   [MQ]    Clinical Course User Index [MQ] Sharyn Creamer, MD    ----------------------------------------- 2:17 PM on 05/21/2018 -----------------------------------------  Patient feels well.  Reports she is ready to go home.  Feels much better.  Blood pressure well controlled, will place the patient on a low-dose amlodipine which she will start tomorrow and follow-up closely with her primary care doctor.  She is previously been on this medication in the past as well.  Careful return precautions regarding neck pain, headache reviewed with the patient is in agreement.  She will set up close follow-up with her primary care doctor for follow-up on today's evaluation.  Return precautions and treatment recommendations and follow-up discussed with the patient who is agreeable with the plan.  Ambulatory, feels well no complaints at present. ____________________________________________   FINAL CLINICAL IMPRESSION(S) / ED DIAGNOSES  Final diagnoses:  Strain of neck muscle, initial encounter  Hypokalemia  Hypertension, unspecified type  Note:  This document was prepared using Conservation officer, historic buildings and may include unintentional dictation errors       Sharyn Creamer, MD 05/21/18 1418

## 2018-05-23 ENCOUNTER — Telehealth: Payer: Self-pay | Admitting: Nurse Practitioner

## 2018-05-23 ENCOUNTER — Other Ambulatory Visit: Payer: Self-pay | Admitting: Family Medicine

## 2018-05-23 DIAGNOSIS — I10 Essential (primary) hypertension: Secondary | ICD-10-CM

## 2018-05-23 MED ORDER — HYDRALAZINE HCL 25 MG PO TABS: 25.0000 mg | ORAL_TABLET | Freq: Two times a day (BID) | ORAL | 0 refills | Status: DC

## 2018-05-23 NOTE — Telephone Encounter (Signed)
Pt said the hydrochlorothiazide is causing muscle cramps in neck [phone]

## 2018-05-23 NOTE — Telephone Encounter (Signed)
Already addressed in telephone encounter earlier today 05/23/18.  Saralyn Pilar, DO [facility] 05/23/2018, 6:03 PM

## 2018-05-23 NOTE — Telephone Encounter (Signed)
Covering inbox for Alexa McardleLauren Ingram, AGPCNP-BC while she is out of office.  I reviewed chart last seen by PCP Alexa 04/2018 from her note at that time this is a known problem with med allergies see copy below  Has been on multiple medications for hypertension in past that cause side effects: Has been on carvedilol (red rash), valsartan (made her itch) in past.  Patient reports one medication caused abdominal swelling, but was not  Tekturna-aliskiren (unknown).  Now she is allergic to HCTZ - First I would question her allergy to HCTZ, I understand she may be having side effects from it, but it may not be an actual allergy.  The only other BP medication that I can reasonably think of to try her on would be Hydralazine. She may take 1 pill twice a day to start, and in future if needed it could be increased to 3 times a day or dose increased. I will defer this to PCP at next visit and she may even warrant a 2nd opinion from specialist for further HTN management for 2nd opinion.  Ask her if she is familiar with Hydralazine - and if she agrees to try, I can send new rx today.  Saralyn PilarAlexander Lashaundra Lehrmann, DO [facility] 05/23/2018, 1:34 PM

## 2018-05-23 NOTE — Telephone Encounter (Signed)
Left message for patient to call back  

## 2018-05-23 NOTE — Telephone Encounter (Signed)
Called patient as requested, spoke with her, see message below.  She understands, has never been on Hydralazine, interested to try this option.  I sent rx Hydralazine 25mg  BID for now, advised likely may need future adjust dose if this is monotherapy could do 3 to 4 times a day vs inc dose if needed.  Start with lower dose and gradual increase due to her history of side effects. Reviewed side effect profile, common ones and rare such as lupus-like syndrome.  She has had some labile BP, but sometimes it is normal.  She will follow-up with PCP in 2 days on 05/25/18 to review further. I advised that she may adjust the dose and then in future she can consider referral to Cardiology as option for more of a 2nd opinion from specialist if still difficulty managing HTN.  Saralyn Pilar, DO [facility] 05/23/2018, 6:02 PM

## 2018-05-23 NOTE — Telephone Encounter (Signed)
Pt return you call requesting you call her before 6 [phone]

## 2018-05-23 NOTE — Telephone Encounter (Signed)
As per patient she is allergic to HCTZ causing neck pain and pain in the back side of her head went to ED yesterday. Also she is allergic to amlodipine which was Rx by ED yesterday. Please suggest ?

## 2018-05-25 ENCOUNTER — Other Ambulatory Visit: Payer: Self-pay | Admitting: Nurse Practitioner

## 2018-05-25 ENCOUNTER — Other Ambulatory Visit: Payer: Self-pay

## 2018-05-25 ENCOUNTER — Encounter: Payer: Self-pay | Admitting: Nurse Practitioner

## 2018-05-25 ENCOUNTER — Ambulatory Visit (INDEPENDENT_AMBULATORY_CARE_PROVIDER_SITE_OTHER): Payer: Medicare Other | Admitting: Nurse Practitioner

## 2018-05-25 VITALS — BP 191/83 | HR 95 | Temp 97.7°F | Ht <= 58 in | Wt 115.6 lb

## 2018-05-25 DIAGNOSIS — E876 Hypokalemia: Secondary | ICD-10-CM

## 2018-05-25 DIAGNOSIS — I10 Essential (primary) hypertension: Secondary | ICD-10-CM | POA: Diagnosis not present

## 2018-05-25 MED ORDER — POTASSIUM CHLORIDE CRYS ER 20 MEQ PO TBCR: 20.0000 meq | EXTENDED_RELEASE_TABLET | Freq: Every day | ORAL | 0 refills | Status: DC

## 2018-05-25 NOTE — Progress Notes (Signed)
Subjective:    Patient ID: Alexa Ingram, female    DOB: [birth year], 78 y.o.   MRN: [id number]  Scott Mallicoat is a 78 y.o. female presenting on 05/25/2018 for Hypertension (pt recently prescribed  Hydralazine 25mg  BID. She also was seen in the ER x 4 days ago and diagnose with srain muscle. The pt associates the pain with a reaction to blood pressure medication.)   HPI Hypertension - She is not checking BP at home or outside of clinic.    She had been admitted to ER 4 days ago with neck spasm and was also found to have hypokalemia.  Hypokalemia has not been treated other than stopping HCTZ.  Patient was then placed on hydralazine 25 mg bid and took her first dose today.  Feels slightly jittery. - She is not currently symptomatic. - Pt denies headache, lightheadedness, dizziness, changes in vision, chest tightness/pressure, palpitations, leg swelling, sudden loss of speech or loss of consciousness. - She  reports no regular exercise routine. - Her diet is moderate in salt, moderate in fat, and moderate in carbohydrates.   Social History   Tobacco Use  . Smoking status: Former Smoker    Last attempt to quit: 04/15/2003    Years since quitting: 15.1  . Smokeless tobacco: Never Used  . Tobacco comment: smoked socially most often for about 3-4 years  Substance Use Topics  . Alcohol use: Never    Frequency: Never  . Drug use: Never    Review of Systems Per HPI unless specifically indicated above     Objective:    BP (!) 191/83 (BP Location: Right Arm, Patient Position: Sitting, Cuff Size: Normal)   Pulse 95   Temp 97.7 F (36.5 C) (Oral)   Ht 4\' 8"  (1.422 m)   Wt 115 lb 9.6 oz (52.4 kg)   BMI 25.92 kg/m   Wt Readings from Last 3 Encounters:  05/25/18 115 lb 9.6 oz (52.4 kg)  05/21/18 114 lb (51.7 kg)  04/14/18 114 lb 3.2 oz (51.8 kg)    Physical Exam Vitals signs reviewed.  Constitutional:      General: She is not in acute distress.    Appearance: She is  well-developed.  HENT:     Head: Normocephalic and atraumatic.  Cardiovascular:     Rate and Rhythm: Normal rate and regular rhythm.     Pulses:          Radial pulses are 2+ on the right side and 2+ on the left side.       Posterior tibial pulses are 1+ on the right side and 1+ on the left side.     Heart sounds: Normal heart sounds, S1 normal and S2 normal.  Pulmonary:     Effort: Pulmonary effort is normal. No respiratory distress.     Breath sounds: Normal breath sounds and air entry.  Musculoskeletal:     Right lower leg: No edema.     Left lower leg: No edema.  Skin:    General: Skin is warm and dry.     Capillary Refill: Capillary refill takes less than 2 seconds.  Neurological:     Mental Status: She is alert and oriented to person, place, and time.  Psychiatric:        Attention and Perception: Attention normal.        Mood and Affect: Mood and affect normal.        Behavior: Behavior normal. Behavior is cooperative.  Results for orders placed or performed during the hospital encounter of 05/21/18  CBC  Result Value Ref Range   WBC 12.8 (H) 4.0 - 10.5 K/uL   RBC 5.12 (H) 3.87 - 5.11 MIL/uL   Hemoglobin 15.6 (H) 12.0 - 15.0 g/dL   HCT 16.145.3 09.636.0 - 04.546.0 %   MCV 88.5 80.0 - 100.0 fL   MCH 30.5 26.0 - 34.0 pg   MCHC 34.4 30.0 - 36.0 g/dL   RDW 40.912.5 81.111.5 - 91.415.5 %   Platelets 261 150 - 400 K/uL   nRBC 0.0 0.0 - 0.2 %  Basic metabolic panel  Result Value Ref Range   Sodium 138 135 - 145 mmol/L   Potassium 2.7 (LL) 3.5 - 5.1 mmol/L   Chloride 98 98 - 111 mmol/L   CO2 31 22 - 32 mmol/L   Glucose, Bld 157 (H) 70 - 99 mg/dL   BUN 16 8 - 23 mg/dL   Creatinine, Ser 7.821.17 (H) 0.44 - 1.00 mg/dL   Calcium 9.7 8.9 - 95.610.3 mg/dL   GFR calc non Af Amer 44 (L) >60 mL/min   GFR calc Af Amer 51 (L) >60 mL/min   Anion gap 9 5 - 15  Troponin I - Add-On to previous collection  Result Value Ref Range   Troponin I <0.03 <0.03 ng/mL      Assessment & Plan:   Problem List  Items Addressed This Visit      Cardiovascular and Mediastinum   Hypertension Uncontrolled hypertension.  BP goal < 140/90.  Pt is not working on lifestyle modifications.  Taking medications tolerating well without side effects. Patient has had no prior workup for renovascular hypertension.  Is increasingly difficult to manage due to patient's perceived allergies to medications which are in actuality mostly intolerances.  Plan: 1. Continue taking hydralazine.  INCREASE to 50 mg twice daily 2. Obtain labs in about 2 weeks for recheck of hypokalemia.  3. Encouraged heart healthy diet and increasing exercise to 30 minutes most days of the week. 4. Check BP 1-2 x per week at home, keep log, and bring to clinic at next appointment. 5. Renal artery us ordered 6. Referral placed to cardiology for hypertension management. 7. Follow up 1 months.     Relevant Orders   Ambulatory referral to Cardiology   Comprehensive metabolic panel   US Renal Artery Stenosis    Other Visit Diagnoses    Hypokalemia    -  Primary START taking potassium chloride 20 meq once daily for 7 days.  Then recheck labs.  Remain off HCTZ.  Follow-up prn after labs.   Relevant Medications   potassium chloride SA (K-DUR,KLOR-CON) 20 MEQ tablet   Other Relevant Orders   Comprehensive metabolic panel   US Renal Artery Stenosis      Meds ordered this encounter  Medications  . potassium chloride SA (K-DUR,KLOR-CON) 20 MEQ tablet    Sig: Take 1 tablet (20 mEq total) by mouth daily.    Dispense:  7 tablet    Refill:  0    Order Specific Question:   Supervising Provider    Answer:   Smitty CordsKARAMALEGOS, ALEXANDER J [[id number]]   Follow up plan: Return in about 4 weeks (around 06/22/2018) for hypertension.  A total of 25 minutes was spent face-to-face with this patient. Greater than 50% of this time was spent in counseling and coordination of care with the patient regarding hypertension disease education, medication instructions, review  of amlodipine to reconsider restarting, discussion  of renal impacts on BP and need for [place] testing, referral to Cardiology.    Wilhelmina Mcardle, DNP, AGPCNP-BC Adult Gerontology Primary Care Nurse Practitioner [facility] 05/25/2018, 12:00 PM

## 2018-05-25 NOTE — Patient Instructions (Addendum)
Alexa Ingram,   Thank you for coming in to clinic today.  1. Hydralazine 50 mg total twice daily.  Take two 25 mg tablets twice daily.  2. CARDIOLOGY [facility] [address]  INSIDE the [facility] at [facility]. [address] [place], [place] [address] Main: [phone]  Fax: [phone]  Please schedule a follow-up appointment with Wilhelmina McardleLauren Vaneta Hammontree, AGNP. Return in about 4 weeks (around 06/22/2018) for hypertension.   If you have any other questions or concerns, please feel free to call the clinic or send a message through MyChart. You may also schedule an earlier appointment if necessary.  You will receive a survey after today's visit either digitally by e-mail or paper by [employer] mail. Your experiences and feedback matter to us.  Please respond so we know how we are doing as we provide care for you.   Wilhelmina McardleLauren Belia Febo, DNP, AGNP-BC Adult Gerontology Nurse Practitioner [facility]

## 2018-06-01 ENCOUNTER — Encounter: Payer: Self-pay | Admitting: Nurse Practitioner

## 2018-06-01 MED ORDER — HYDRALAZINE HCL 50 MG PO TABS: 50.0000 mg | ORAL_TABLET | Freq: Two times a day (BID) | ORAL | 5 refills | Status: DC

## 2018-06-27 ENCOUNTER — Ambulatory Visit: Payer: Medicare Other | Admitting: Nurse Practitioner

## 2019-01-19 IMAGING — CT CT ANGIO NECK
2 of 11 series · 6 of 35 positions shown · IV contrast (omnipaque)
Comparison: None.

CLINICAL DATA: Right upper cervical neck pain. Rule out dissection.
Headache.

EXAM:
CT ANGIOGRAPHY HEAD AND NECK
TECHNIQUE: Multidetector CT imaging of the head and neck was performed using
the standard protocol during bolus administration of intravenous
contrast. Multiplanar CT image reconstructions and MIPs were
obtained to evaluate the vascular anatomy. Carotid stenosis
measurements (when applicable) are obtained utilizing NASCET
criteria, using the distal internal carotid diameter as the
denominator.
CONTRAST:  60mL OMNIPAQUE IOHEXOL 350 MG/ML SOLN

[Series 6: ax thin · axial · 0.45mm/px · z∈[-297,-81]mm · 5 of 319 slices shown]
[im 54/319  soft-tissue]
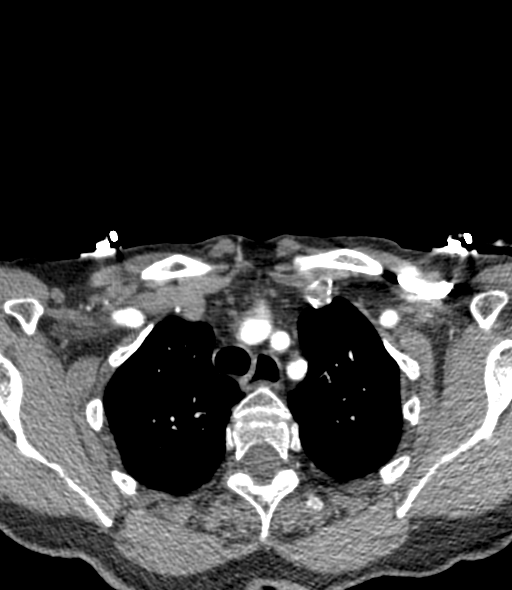
[im 107/319  bone]
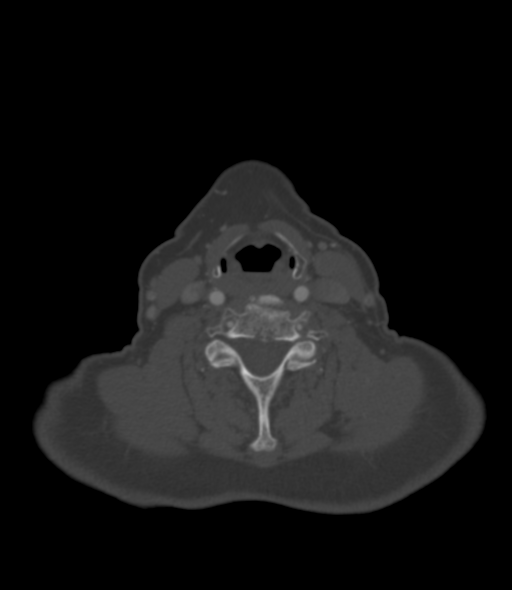
[im 160/319  soft-tissue]
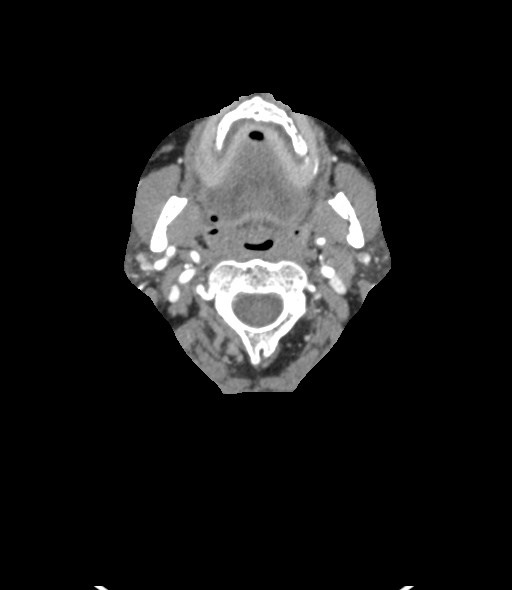
[im 213/319  bone]
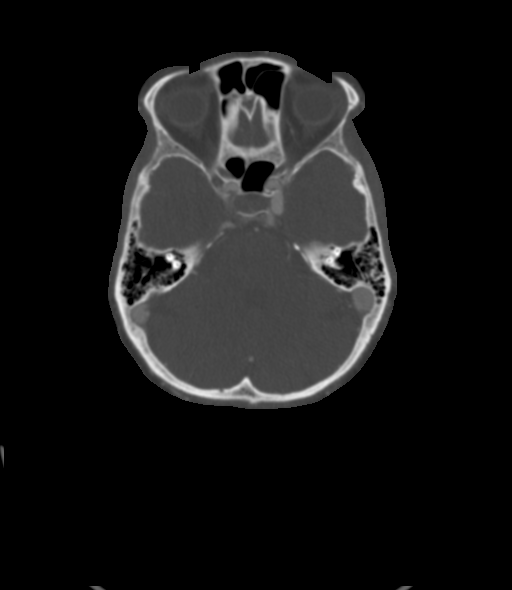
[im 266/319  soft-tissue]
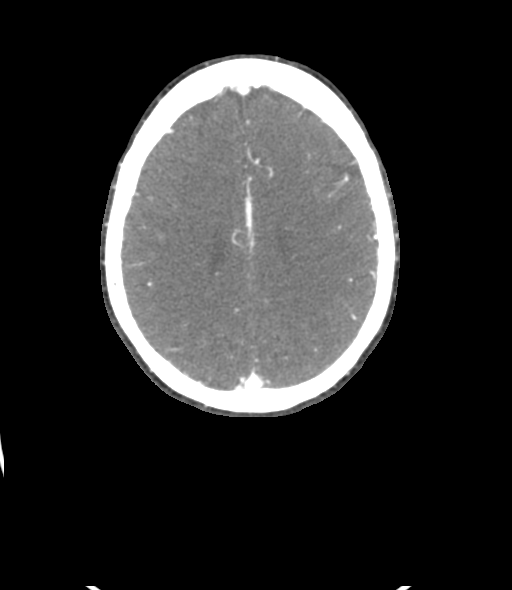

[Series 8: sagittal thin · sagittal · 0.52mm/px · 1 of 230 slices shown]
[im 191/230  soft-tissue]
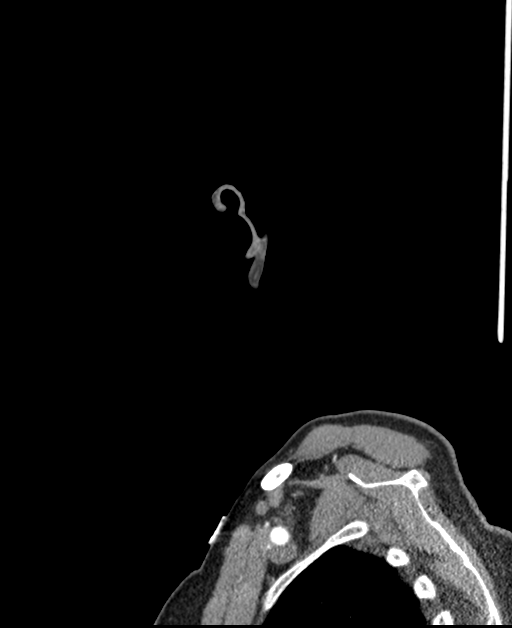

[6 of 35 positions shown; findings below may reference images not displayed]

FINDINGS: CT HEAD FINDINGS

Brain: No evidence of acute infarction, hemorrhage, hydrocephalus,
extra-axial collection or mass lesion/mass effect.

Vascular: Negative for hyperdense vessel

Skull: Negative

Sinuses: Negative

Orbits: Negative

Review of the MIP images confirms the above findings

CTA NECK FINDINGS

Aortic arch: Mild atherosclerotic calcification aortic arch and
proximal great vessels.

Right carotid system: Mild atherosclerotic calcification right
common carotid artery. Calcified plaque proximal right internal
carotid artery narrowing the lumen by 65% diameter stenosis.

Left carotid system: Mild atherosclerotic disease left common
carotid artery. Atherosclerotic calcification left carotid
bifurcation. 25% diameter stenosis left internal carotid artery.

Vertebral arteries: Small vertebral arteries bilaterally. Negative
for stenosis or dissection.

Skeleton: No acute abnormality.

Other neck: Negative

Upper chest: Negative

Review of the MIP images confirms the above findings

CTA HEAD FINDINGS

Anterior circulation: Cavernous carotid widely patent bilaterally
with minimal atherosclerotic disease. Anterior and middle cerebral
arteries patent bilaterally. Mild stenosis right MCA bifurcation.

Posterior circulation: Fetal origin of the posterior cerebral
arteries with hypoplastic vertebral arteries and basilar artery.
Left vertebral artery ends in PICA. PICA patent bilaterally. Basilar
patent. Posterior cerebral arteries patent bilaterally without
stenosis.

Venous sinuses: Normal enhancement.

Anatomic variants: None

Delayed phase: Normal enhancement postcontrast administration.

Review of the MIP images confirms the above findings
IMPRESSION: 1. Negative for carotid or vertebral dissection
diameter stenosis proximal left internal carotid artery
3. Mild stenosis right MCA bifurcation
4. Negative for emergent large vessel occlusion.

## 2019-03-18 ENCOUNTER — Other Ambulatory Visit: Payer: Self-pay

## 2019-03-18 ENCOUNTER — Ambulatory Visit
Admission: EM | Admit: 2019-03-18 | Discharge: 2019-03-18 | Disposition: A | Discharge status: Home / Self Care | Payer: Medicare Other | Attending: Emergency Medicine | Admitting: Emergency Medicine

## 2019-03-18 ENCOUNTER — Encounter: Payer: Self-pay | Admitting: Emergency Medicine

## 2019-03-18 DIAGNOSIS — W231XXA Caught, crushed, jammed, or pinched between stationary objects, initial encounter: Secondary | ICD-10-CM | POA: Diagnosis not present

## 2019-03-18 DIAGNOSIS — S61309A Unspecified open wound of unspecified finger with damage to nail, initial encounter: Secondary | ICD-10-CM

## 2019-03-18 DIAGNOSIS — I10 Essential (primary) hypertension: Secondary | ICD-10-CM

## 2019-03-18 MED ORDER — AMLODIPINE BESYLATE 5 MG PO TABS: 5.0000 mg | ORAL_TABLET | Freq: Every day | ORAL | 0 refills | Status: DC

## 2019-03-18 NOTE — ED Provider Notes (Signed)
[facility]    CSN: [id number] Arrival date & time: 03/18/19  1535      History   Chief Complaint Chief Complaint  Patient presents with  . Nail Problem    HPI Alexa Ingram is a 78 y.o. female.        78 year old female presents with a partial avulsion of her right thumbnail.  She states that she got her thumb caught in the breaker on the electrical panel at her home on Thursday evening 2 days prior to this visit.  The thumb nail is still attached at the base.   Problem is that of hypertension.  Initial blood pressure was 203/116.  Repeat showed it to be 238/101.  She has a prescription bottle (sample) of amlodipine/olmesartan 5-20; she states she takes infrequently.  Denies any headache shortness of breath, chest pain or altered mental condition.  Denies and anuria, hematuria tearing sensation into her back or legs .She does not currently have a primary care physician.  Says that she always has whitecoat hypertension when visiting doctors.      Past Medical History:  Diagnosis Date  . Hypertension   . Osteopenia   . Pure hypercholesterolemia     Patient Active Problem List   Diagnosis Date Noted  . Hypertension   . Osteopenia   . Pure hypercholesterolemia     Past Surgical History:  Procedure Laterality Date  . ABDOMINAL HYSTERECTOMY     ovaries remain  . BREAST BIOPSY Right 1986   neg    OB History   No obstetric history on file.      Home Medications    Prior to Admission medications   Medication Sig Start Date End Date Taking? Authorizing Provider  amLODipine-olmesartan (AZOR) 5-20 MG tablet Take 1 tablet by mouth daily.   Yes [provider]  hydrALAZINE (APRESOLINE) 50 MG tablet Take 1 tablet (50 mg total) by mouth 2 (two) times daily. 06/01/18  Yes Galen ManilaKennedy, Lauren Renee, NP  vitamin C (ASCORBIC ACID) 500 MG tablet Take 500 mg by mouth daily.   Yes [provider]  amLODipine (NORVASC) 5 MG tablet Take 1  tablet (5 mg total) by mouth daily. 03/18/19   Lutricia Feiloemer,  P, PA-C  potassium chloride SA (K-DUR,KLOR-CON) 20 MEQ tablet Take 1 tablet (20 mEq total) by mouth daily. 05/25/18 03/18/19  Galen ManilaKennedy, Lauren Renee, NP    Family History Family History  Problem Relation Age of Onset  . Breast cancer Sister [id number]  . Stroke Sister   . Breast cancer Paternal Aunt 8790  . Stroke Mother   . Hypertension Mother   . Healthy Daughter   . Healthy Son     Social History Social History   Tobacco Use  . Smoking status: Former Smoker    Quit date: 04/15/2003    Years since quitting: 15.9  . Smokeless tobacco: Never Used  . Tobacco comment: smoked socially most often for about 3-4 years  Substance Use Topics  . Alcohol use: Never    Frequency: Never  . Drug use: Never     Allergies   Sulfa antibiotics, Sulfate, and Penicillin g   Review of Systems Review of Systems  Constitutional: Positive for activity change. Negative for appetite change, chills, diaphoresis, fatigue and fever.  Respiratory: Negative for cough, chest tightness and shortness of breath.   Cardiovascular: Negative for chest pain.  Genitourinary: Negative for hematuria.  Musculoskeletal: Negative for back pain, gait problem, myalgias and neck stiffness.  Skin: Positive  for wound.  Neurological: Negative for dizziness, tremors, seizures, syncope, speech difficulty, weakness, numbness and headaches.  All other systems reviewed and are negative.    Physical Exam Triage Vital Signs ED Triage Vitals  Enc Vitals Group     BP 03/18/19 1546 (!) 203/116     Pulse Rate 03/18/19 1546 (!) 106     Resp 03/18/19 1546 16     Temp 03/18/19 1546 98.1 F (36.7 C)     Temp Source 03/18/19 1546 Oral     SpO2 03/18/19 1546 99 %     Weight 03/18/19 1544 113 lb (51.3 kg)     Height 03/18/19 1544 4\' 11"  (1.499 m)     Head Circumference --      Peak Flow --      Pain Score 03/18/19 1544 8     Pain Loc --      Pain Edu? --      Excl.  in GC? --    No data found.  Updated Vital Signs BP (!) 225/103 (BP Location: Left Arm)   Pulse (!) 106   Temp 98.1 F (36.7 C) (Oral)   Resp 16   Ht 4\' 11"  (1.499 m)   Wt 113 lb (51.3 kg)   SpO2 99%   BMI 22.82 kg/m   Visual Acuity Right Eye Distance:   Left Eye Distance:   Bilateral Distance:    Right Eye Near:   Left Eye Near:    Bilateral Near:     Physical Exam Vitals signs and nursing note reviewed.  Constitutional:      General: She is not in acute distress.    Appearance: Normal appearance. She is normal weight. She is not ill-appearing or toxic-appearing.  HENT:     Head: Normocephalic and atraumatic.     Nose: Nose normal.  Eyes:     Conjunctiva/sclera: Conjunctivae normal.  Neck:     Musculoskeletal: Normal range of motion and neck supple.  Cardiovascular:     Rate and Rhythm: Normal rate and regular rhythm.     Heart sounds: Normal heart sounds.  Pulmonary:     Effort: Pulmonary effort is normal.     Breath sounds: Normal breath sounds.  Abdominal:     General: Abdomen is flat.     Palpations: Abdomen is soft.  Musculoskeletal: Normal range of motion.     Comments: Examination of the right thumb is a partial avulsion of the right nail.  The nail is only partially out of the eponychium early.  Nail is largely attached to the nail bed base.  No bleeding present.  Skin:    General: Skin is warm and dry.  Neurological:     General: No focal deficit present.     Mental Status: She is alert and oriented to person, place, and time.  Psychiatric:        Mood and Affect: Mood normal.        Behavior: Behavior normal.        Thought Content: Thought content normal.        Judgment: Judgment normal.      UC Treatments / Results  Labs (all labs ordered are listed, but only abnormal results are displayed) Labs Reviewed - No data to display  EKG   Radiology No results found.  Procedures The nail was cleansed without removing the nail.  A vsteri  strip was placed over to secure the nail in place for healing.  A stack splint was  then applied to the finger to prevent repetitive  snagging of her nail.  Patient was encouraged to follow-up with a primary care physician or may return to our clinic for any needs.  And was told that she will likely lose the nail will take many months for the new nail to regrow.  Medications Ordered in UC Medications - No data to display  Initial Impression / Assessment and Plan / UC Course  I have reviewed the triage vital signs and the nursing notes.  Pertinent labs & imaging results that were available during my care of the patient were reviewed by me and considered in my medical decision making (see chart for details).   Patient has a hypertension urgency without any endorgan apparent damage.  I will place her on amlodipine 5 mg daily.  She was given a prescription for 30 days.  She was to purchase a blood pressure cuff and to begin recording her blood pressure with date time pressure and pulse rate.  We will present this to her provider on follow-up.  If she is unable to secure a appointment she will return to our clinic within 2 weeks or sooner if necessary.  If she has any changes she should go immediately to the emergency room.   Final Clinical Impressions(s) / UC Diagnoses   Final diagnoses:  Nail avulsion, finger, initial encounter  Malignant hypertension     Discharge Instructions     Wear the splint over your avulsed nail to prevent further injury.  Follow-up with primary care physician for both your hypertension and nail problem.  Buy a quality blood pressure cuff to measure your blood pressure daily record the date time blood pressure and pulse readings ;present these to your primary care physician at follow-up.  Be certain to take your blood pressure medicine on a daily basis.  Return to our clinic in 2 weeks if you are unable to obtain a primary care physician.  If you have any changes with  urine ,vision ,chest pain , or breathing go immediately to the emergency room.    ED Prescriptions    Medication Sig Dispense Auth. Provider   amLODipine (NORVASC) 5 MG tablet Take 1 tablet (5 mg total) by mouth daily. 30 tablet Lorin Picket, PA-C     PDMP not reviewed this encounter.   Lorin Picket, PA-C 03/18/19 1738

## 2019-03-18 NOTE — ED Triage Notes (Signed)
Patient states that she got her right thumb caught in the breaker at her home on Thursday.  Patient states that it pulled her nail off on her right thumb.  Patient c/o pain in her right thumb.

## 2019-03-18 NOTE — Discharge Instructions (Addendum)
Wear the splint over your avulsed nail to prevent further injury.  Follow-up with primary care physician for both your hypertension and nail problem.  Buy a quality blood pressure cuff to measure your blood pressure daily record the date time blood pressure and pulse readings ;present these to your primary care physician at follow-up.  Be certain to take your blood pressure medicine on a daily basis.  Return to our clinic in 2 weeks if you are unable to obtain a primary care physician.  If you have any changes with urine ,vision ,chest pain , or breathing go immediately to the emergency room.

## 2020-04-15 DIAGNOSIS — L089 Local infection of the skin and subcutaneous tissue, unspecified: Secondary | ICD-10-CM | POA: Diagnosis not present

## 2020-05-04 DIAGNOSIS — M19042 Primary osteoarthritis, left hand: Secondary | ICD-10-CM | POA: Diagnosis not present

## 2020-05-04 DIAGNOSIS — M79645 Pain in left finger(s): Secondary | ICD-10-CM | POA: Diagnosis not present

## 2021-08-12 DIAGNOSIS — H2513 Age-related nuclear cataract, bilateral: Secondary | ICD-10-CM | POA: Diagnosis not present

## 2021-08-13 DIAGNOSIS — H5213 Myopia, bilateral: Secondary | ICD-10-CM | POA: Diagnosis not present

## 2021-09-15 DIAGNOSIS — H52223 Regular astigmatism, bilateral: Secondary | ICD-10-CM | POA: Diagnosis not present

## 2021-09-15 DIAGNOSIS — H524 Presbyopia: Secondary | ICD-10-CM | POA: Diagnosis not present

## 2021-09-15 DIAGNOSIS — H5203 Hypermetropia, bilateral: Secondary | ICD-10-CM | POA: Diagnosis not present

## 2021-11-06 DIAGNOSIS — M79671 Pain in right foot: Secondary | ICD-10-CM | POA: Diagnosis not present

## 2021-11-06 DIAGNOSIS — I10 Essential (primary) hypertension: Secondary | ICD-10-CM | POA: Diagnosis not present

## 2023-05-03 ENCOUNTER — Emergency Department: Payer: 59

## 2023-05-03 ENCOUNTER — Other Ambulatory Visit: Payer: Self-pay

## 2023-05-03 ENCOUNTER — Ambulatory Visit: Payer: Self-pay

## 2023-05-03 DIAGNOSIS — Z91148 Patient's other noncompliance with medication regimen for other reason: Secondary | ICD-10-CM

## 2023-05-03 DIAGNOSIS — I10 Essential (primary) hypertension: Secondary | ICD-10-CM | POA: Diagnosis not present

## 2023-05-03 DIAGNOSIS — G9341 Metabolic encephalopathy: Secondary | ICD-10-CM | POA: Diagnosis not present

## 2023-05-03 DIAGNOSIS — I6389 Other cerebral infarction: Secondary | ICD-10-CM | POA: Diagnosis not present

## 2023-05-03 DIAGNOSIS — Z880 Allergy status to penicillin: Secondary | ICD-10-CM | POA: Diagnosis not present

## 2023-05-03 DIAGNOSIS — Z882 Allergy status to sulfonamides status: Secondary | ICD-10-CM | POA: Diagnosis not present

## 2023-05-03 DIAGNOSIS — Z79899 Other long term (current) drug therapy: Secondary | ICD-10-CM | POA: Diagnosis not present

## 2023-05-03 DIAGNOSIS — R29818 Other symptoms and signs involving the nervous system: Secondary | ICD-10-CM | POA: Diagnosis not present

## 2023-05-03 DIAGNOSIS — R410 Disorientation, unspecified: Secondary | ICD-10-CM | POA: Diagnosis not present

## 2023-05-03 DIAGNOSIS — I63231 Cerebral infarction due to unspecified occlusion or stenosis of right carotid arteries: Principal | ICD-10-CM | POA: Diagnosis present

## 2023-05-03 DIAGNOSIS — Z91199 Patient's noncompliance with other medical treatment and regimen due to unspecified reason: Secondary | ICD-10-CM

## 2023-05-03 DIAGNOSIS — I6782 Cerebral ischemia: Secondary | ICD-10-CM | POA: Diagnosis not present

## 2023-05-03 DIAGNOSIS — R1031 Right lower quadrant pain: Secondary | ICD-10-CM | POA: Diagnosis not present

## 2023-05-03 DIAGNOSIS — I639 Cerebral infarction, unspecified: Secondary | ICD-10-CM | POA: Diagnosis not present

## 2023-05-03 DIAGNOSIS — I161 Hypertensive emergency: Secondary | ICD-10-CM | POA: Diagnosis present

## 2023-05-03 DIAGNOSIS — I700 Atherosclerosis of aorta: Secondary | ICD-10-CM | POA: Diagnosis not present

## 2023-05-03 DIAGNOSIS — I6521 Occlusion and stenosis of right carotid artery: Secondary | ICD-10-CM | POA: Diagnosis not present

## 2023-05-03 DIAGNOSIS — K5730 Diverticulosis of large intestine without perforation or abscess without bleeding: Secondary | ICD-10-CM | POA: Diagnosis not present

## 2023-05-03 DIAGNOSIS — I708 Atherosclerosis of other arteries: Secondary | ICD-10-CM | POA: Diagnosis not present

## 2023-05-03 DIAGNOSIS — K8020 Calculus of gallbladder without cholecystitis without obstruction: Secondary | ICD-10-CM | POA: Diagnosis not present

## 2023-05-03 DIAGNOSIS — R29701 NIHSS score 1: Secondary | ICD-10-CM | POA: Diagnosis present

## 2023-05-03 DIAGNOSIS — R319 Hematuria, unspecified: Secondary | ICD-10-CM | POA: Diagnosis not present

## 2023-05-03 DIAGNOSIS — N390 Urinary tract infection, site not specified: Secondary | ICD-10-CM | POA: Diagnosis not present

## 2023-05-03 DIAGNOSIS — R4182 Altered mental status, unspecified: Secondary | ICD-10-CM | POA: Diagnosis not present

## 2023-05-03 DIAGNOSIS — I6622 Occlusion and stenosis of left posterior cerebral artery: Secondary | ICD-10-CM | POA: Diagnosis not present

## 2023-05-03 LAB — COMPREHENSIVE METABOLIC PANEL
ALT: 16 U/L (ref 0–44)
AST: 20 U/L (ref 15–41)
Albumin: 4.1 g/dL (ref 3.5–5.0)
Alkaline Phosphatase: 79 U/L (ref 38–126)
Anion gap: 12 (ref 5–15)
BUN: 16 mg/dL (ref 8–23)
CO2: 26 mmol/L (ref 22–32)
Calcium: 9.3 mg/dL (ref 8.9–10.3)
Chloride: 102 mmol/L (ref 98–111)
Creatinine, Ser: 0.99 mg/dL (ref 0.44–1.00)
GFR, Estimated: 56 mL/min — ABNORMAL LOW (ref >60)
Glucose, Bld: 102 mg/dL — ABNORMAL HIGH (ref 70–99)
Potassium: 3.7 mmol/L (ref 3.5–5.1)
Sodium: 140 mmol/L (ref 135–145)
Total Bilirubin: 1 mg/dL (ref 0.0–1.2)
Total Protein: 7.2 g/dL (ref 6.5–8.1)

## 2023-05-03 LAB — URINALYSIS, ROUTINE W REFLEX MICROSCOPIC
Bilirubin Urine: NEGATIVE
Glucose, UA: NEGATIVE mg/dL
Hgb urine dipstick: NEGATIVE
Ketones, ur: NEGATIVE mg/dL
Nitrite: NEGATIVE
Protein, ur: 30 mg/dL — AB
Specific Gravity, Urine: 1.02 (ref 1.005–1.030)
pH: 5 (ref 5.0–8.0)

## 2023-05-03 LAB — CBC WITH DIFFERENTIAL/PLATELET
Abs Immature Granulocytes: 0.02 10*3/uL (ref 0.00–0.07)
Basophils Absolute: 0.1 10*3/uL (ref 0.0–0.1)
Basophils Relative: 1 %
Eosinophils Absolute: 0.2 10*3/uL (ref 0.0–0.5)
Eosinophils Relative: 2 %
HCT: 45.4 % (ref 36.0–46.0)
Hemoglobin: 15.3 g/dL — ABNORMAL HIGH (ref 12.0–15.0)
Immature Granulocytes: 0 %
Lymphocytes Relative: 26 %
Lymphs Abs: 2.4 10*3/uL (ref 0.7–4.0)
MCH: 30.5 pg (ref 26.0–34.0)
MCHC: 33.7 g/dL (ref 30.0–36.0)
MCV: 90.6 fL (ref 80.0–100.0)
Monocytes Absolute: 0.6 10*3/uL (ref 0.1–1.0)
Monocytes Relative: 6 %
Neutro Abs: 6.2 10*3/uL (ref 1.7–7.7)
Neutrophils Relative %: 65 %
Platelets: 254 10*3/uL (ref 150–400)
RBC: 5.01 MIL/uL (ref 3.87–5.11)
RDW: 13.3 % (ref 11.5–15.5)
WBC: 9.5 10*3/uL (ref 4.0–10.5)
nRBC: 0 % (ref 0.0–0.2)

## 2023-05-03 NOTE — Telephone Encounter (Signed)
  Chief Complaint: flank pain Symptoms: R flank pain, disorientation, visual hallucinations  Frequency: last week  Pertinent Negatives: Patient denies any urinary sx  Disposition: [x] ED /[] Urgent Care (no appt availability in office) / [] Appointment(In office/virtual)/ []  [facility]/ [] Home Care/ [] Refused Recommended Disposition /[] [place] [facility]/ []  Follow-up with PCP Additional Notes: pt's granddaughter calling in for pt. States she is having above sx, having visual hallucinations of daughter who passed last year. Has called local UC and no appts available. Offered to schedule UC appt tomorrow or go to ED. She preferred to schedule UC for tomorrow and can call and cancel if goes to ED today.   Summary: Back pain, no appt   Back pain possible kidney infection, granddaughter believs its really bad bc the patient is in pain.     Reason for Disposition  Patient sounds very sick or weak to the triager  Answer Assessment - Initial Assessment Questions 1. LOCATION: "Where does it hurt?" (e.g., left, right)     R Lower back  2. ONSET: "When did the pain start?"     Last week  3. SEVERITY: "How bad is the pain?" (e.g., Scale 1-10; mild, moderate, or severe)   - MILD (1-3): doesn't interfere with normal activities    - MODERATE (4-7): interferes with normal activities or awakens from sleep    - SEVERE (8-10): excruciating pain and patient unable to do normal activities (stays in bed)       Granddaughter unsure  4. PATTERN: "Does the pain come and go, or is it constant?"      Constant  5. CAUSE: "What do you think is causing the pain?"     Possible kidney infection  6. OTHER SYMPTOMS:  "Do you have any other symptoms?" (e.g., fever, abdomen pain, vomiting, leg weakness, burning with urination, blood in urine)  Protocols used: Flank Pain-A-AH

## 2023-05-03 NOTE — ED Triage Notes (Addendum)
Pt c/o R lower back/hip pain x1 week. Pt denies fall or injury. Pt denies urinary sx. Pt ambulatory in triage with normal gait. Pt reports normal BM. Pt denies parasthesias. Pt reports pain increases with movement. Grandson concerned because pt is becoming more forgetful and says that she has been seeing family members that have passed away.

## 2023-05-04 ENCOUNTER — Inpatient Hospital Stay
Admission: EM | Admit: 2023-05-04 | Discharge: 2023-05-05 | DRG: 064 | Disposition: A | Discharge status: Home Health | Payer: 59 | Attending: Student | Admitting: Student

## 2023-05-04 ENCOUNTER — Ambulatory Visit: Payer: Self-pay

## 2023-05-04 ENCOUNTER — Inpatient Hospital Stay: Payer: 59

## 2023-05-04 ENCOUNTER — Inpatient Hospital Stay (HOSPITAL_COMMUNITY)
Admit: 2023-05-04 | Discharge: 2023-05-04 | Disposition: A | Discharge status: Home / Self Care | Payer: 59 | Attending: Internal Medicine | Admitting: Internal Medicine

## 2023-05-04 ENCOUNTER — Encounter: Payer: Self-pay | Admitting: Family Medicine

## 2023-05-04 DIAGNOSIS — I639 Cerebral infarction, unspecified: Secondary | ICD-10-CM | POA: Diagnosis present

## 2023-05-04 DIAGNOSIS — R8271 Bacteriuria: Secondary | ICD-10-CM

## 2023-05-04 DIAGNOSIS — R109 Unspecified abdominal pain: Principal | ICD-10-CM

## 2023-05-04 DIAGNOSIS — G9340 Encephalopathy, unspecified: Secondary | ICD-10-CM | POA: Insufficient documentation

## 2023-05-04 DIAGNOSIS — Z79899 Other long term (current) drug therapy: Secondary | ICD-10-CM | POA: Diagnosis not present

## 2023-05-04 DIAGNOSIS — I63231 Cerebral infarction due to unspecified occlusion or stenosis of right carotid arteries: Secondary | ICD-10-CM | POA: Diagnosis present

## 2023-05-04 DIAGNOSIS — R319 Hematuria, unspecified: Secondary | ICD-10-CM | POA: Diagnosis present

## 2023-05-04 DIAGNOSIS — I6521 Occlusion and stenosis of right carotid artery: Secondary | ICD-10-CM | POA: Diagnosis not present

## 2023-05-04 DIAGNOSIS — I6622 Occlusion and stenosis of left posterior cerebral artery: Secondary | ICD-10-CM | POA: Diagnosis not present

## 2023-05-04 DIAGNOSIS — Z91148 Patient's other noncompliance with medication regimen for other reason: Secondary | ICD-10-CM | POA: Diagnosis not present

## 2023-05-04 DIAGNOSIS — I161 Hypertensive emergency: Secondary | ICD-10-CM | POA: Diagnosis present

## 2023-05-04 DIAGNOSIS — K8020 Calculus of gallbladder without cholecystitis without obstruction: Secondary | ICD-10-CM | POA: Diagnosis not present

## 2023-05-04 DIAGNOSIS — I6389 Other cerebral infarction: Secondary | ICD-10-CM | POA: Diagnosis not present

## 2023-05-04 DIAGNOSIS — R4182 Altered mental status, unspecified: Secondary | ICD-10-CM | POA: Diagnosis present

## 2023-05-04 DIAGNOSIS — I10 Essential (primary) hypertension: Secondary | ICD-10-CM | POA: Diagnosis present

## 2023-05-04 DIAGNOSIS — Z91199 Patient's noncompliance with other medical treatment and regimen due to unspecified reason: Secondary | ICD-10-CM | POA: Diagnosis not present

## 2023-05-04 DIAGNOSIS — I700 Atherosclerosis of aorta: Secondary | ICD-10-CM | POA: Diagnosis not present

## 2023-05-04 DIAGNOSIS — G9341 Metabolic encephalopathy: Secondary | ICD-10-CM | POA: Diagnosis present

## 2023-05-04 DIAGNOSIS — R29701 NIHSS score 1: Secondary | ICD-10-CM | POA: Diagnosis present

## 2023-05-04 DIAGNOSIS — Z882 Allergy status to sulfonamides status: Secondary | ICD-10-CM | POA: Diagnosis not present

## 2023-05-04 DIAGNOSIS — N390 Urinary tract infection, site not specified: Secondary | ICD-10-CM | POA: Diagnosis present

## 2023-05-04 DIAGNOSIS — I708 Atherosclerosis of other arteries: Secondary | ICD-10-CM | POA: Diagnosis not present

## 2023-05-04 DIAGNOSIS — R29818 Other symptoms and signs involving the nervous system: Secondary | ICD-10-CM | POA: Diagnosis not present

## 2023-05-04 DIAGNOSIS — K5730 Diverticulosis of large intestine without perforation or abscess without bleeding: Secondary | ICD-10-CM | POA: Diagnosis not present

## 2023-05-04 DIAGNOSIS — Z880 Allergy status to penicillin: Secondary | ICD-10-CM | POA: Diagnosis not present

## 2023-05-04 LAB — ECHOCARDIOGRAM COMPLETE
AR max vel: 1.96 cm2
AV Area VTI: 1.96 cm2
AV Area mean vel: 1.95 cm2
AV Mean grad: 8 mm[Hg]
AV Peak grad: 16.5 mm[Hg]
Ao pk vel: 2.03 m/s
Area-P 1/2: 2.94 cm2
Calc EF: 55.7 %
MV VTI: 2.16 cm2
P 1/2 time: 376 ms
S' Lateral: 3.4 cm
Single Plane A2C EF: 58.4 %
Single Plane A4C EF: 51.8 %
Weight: 2928 [oz_av]

## 2023-05-04 LAB — HEMOGLOBIN A1C
Hgb A1c MFr Bld: 5.8 % — ABNORMAL HIGH (ref 4.8–5.6)
Mean Plasma Glucose: 120 mg/dL

## 2023-05-04 MED ORDER — LIDOCAINE 5 % EX PTCH
1.0000 | MEDICATED_PATCH | CUTANEOUS | Status: DC
Start: 2023-05-04 — End: 2023-05-06
  Administered 2023-05-04: 1 via TRANSDERMAL
  Filled 2023-05-04 (×2): qty 1

## 2023-05-04 MED ORDER — HYDRALAZINE HCL 20 MG/ML IJ SOLN
5.0000 mg | Freq: Four times a day (QID) | INTRAMUSCULAR | Status: DC | PRN
  Administered 2023-05-04 – 2023-05-05 (×2): 5 mg via INTRAVENOUS
  Filled 2023-05-04 (×2): qty 1

## 2023-05-04 MED ORDER — SODIUM CHLORIDE 0.9 % IV SOLN
1.0000 g | Freq: Once | INTRAVENOUS | Status: AC
  Administered 2023-05-04: 1 g via INTRAVENOUS
  Filled 2023-05-04: qty 10

## 2023-05-04 MED ORDER — SENNOSIDES-DOCUSATE SODIUM 8.6-50 MG PO TABS: 1.0000 | ORAL_TABLET | Freq: Every evening | ORAL | Status: DC | PRN

## 2023-05-04 MED ORDER — ACETAMINOPHEN 325 MG PO TABS: 650.0000 mg | ORAL_TABLET | ORAL | Status: DC | PRN

## 2023-05-04 MED ORDER — IOHEXOL 350 MG/ML SOLN
75.0000 mL | Freq: Once | INTRAVENOUS | Status: AC | PRN
  Administered 2023-05-04: 75 mL via INTRAVENOUS

## 2023-05-04 MED ORDER — ASPIRIN 81 MG PO TBEC
81.0000 mg | DELAYED_RELEASE_TABLET | Freq: Every day | ORAL | Status: DC
  Administered 2023-05-04 – 2023-05-05 (×2): 81 mg via ORAL
  Filled 2023-05-04 (×2): qty 1

## 2023-05-04 MED ORDER — ACETAMINOPHEN 650 MG RE SUPP: 650.0000 mg | RECTAL | Status: DC | PRN

## 2023-05-04 MED ORDER — ACETAMINOPHEN 160 MG/5ML PO SOLN: 650.0000 mg | ORAL | Status: DC | PRN

## 2023-05-04 MED ORDER — SODIUM CHLORIDE 0.9% FLUSH
3.0000 mL | Freq: Two times a day (BID) | INTRAVENOUS | Status: DC
  Administered 2023-05-04: 5 mL via INTRAVENOUS
  Administered 2023-05-04: 10 mL via INTRAVENOUS

## 2023-05-04 MED ORDER — SODIUM CHLORIDE 0.9 % IV SOLN
1.0000 g | INTRAVENOUS | Status: DC
  Administered 2023-05-05: 1 g via INTRAVENOUS
  Filled 2023-05-04: qty 10

## 2023-05-04 MED ORDER — AMLODIPINE BESYLATE 10 MG PO TABS
10.0000 mg | ORAL_TABLET | Freq: Every day | ORAL | Status: DC
  Administered 2023-05-04 – 2023-05-05 (×2): 10 mg via ORAL
  Filled 2023-05-04: qty 2
  Filled 2023-05-04: qty 1

## 2023-05-04 MED ORDER — STROKE: EARLY STAGES OF RECOVERY BOOK
Freq: Once | Status: AC
  Administered 2023-05-04: 1

## 2023-05-04 MED ORDER — SODIUM CHLORIDE 0.9% FLUSH: 3.0000 mL | INTRAVENOUS | Status: DC | PRN

## 2023-05-04 MED ORDER — ONDANSETRON HCL 4 MG/2ML IJ SOLN: 4.0000 mg | Freq: Four times a day (QID) | INTRAMUSCULAR | Status: DC | PRN

## 2023-05-04 MED ORDER — HYDROCHLOROTHIAZIDE 12.5 MG PO TABS
12.5000 mg | ORAL_TABLET | Freq: Every day | ORAL | Status: DC
  Administered 2023-05-04: 12.5 mg via ORAL
  Filled 2023-05-04: qty 1

## 2023-05-04 MED ORDER — LORAZEPAM 2 MG/ML IJ SOLN
1.0000 mg | Freq: Once | INTRAMUSCULAR | Status: AC | PRN
  Administered 2023-05-04: 1 mg via INTRAVENOUS
  Filled 2023-05-04: qty 1

## 2023-05-04 MED ORDER — ENOXAPARIN SODIUM 40 MG/0.4ML IJ SOSY: 40.0000 mg | PREFILLED_SYRINGE | INTRAMUSCULAR | Status: DC

## 2023-05-04 MED ORDER — ENOXAPARIN SODIUM 40 MG/0.4ML IJ SOSY
40.0000 mg | PREFILLED_SYRINGE | INTRAMUSCULAR | Status: DC
  Administered 2023-05-04: 40 mg via SUBCUTANEOUS
  Filled 2023-05-04: qty 0.4

## 2023-05-04 MED ORDER — CLOPIDOGREL BISULFATE 75 MG PO TABS
75.0000 mg | ORAL_TABLET | Freq: Every day | ORAL | Status: DC
  Administered 2023-05-04 – 2023-05-05 (×2): 75 mg via ORAL
  Filled 2023-05-04 (×2): qty 1

## 2023-05-04 MED ORDER — CYCLOBENZAPRINE HCL 10 MG PO TABS: 5.0000 mg | ORAL_TABLET | Freq: Three times a day (TID) | ORAL | Status: DC | PRN

## 2023-05-04 MED ORDER — LISINOPRIL 10 MG PO TABS
10.0000 mg | ORAL_TABLET | Freq: Every day | ORAL | Status: DC
  Administered 2023-05-04 – 2023-05-05 (×2): 10 mg via ORAL
  Filled 2023-05-04 (×2): qty 1

## 2023-05-04 NOTE — ED Notes (Signed)
Patient family member called out. Patient was having cramping in the left leg.

## 2023-05-04 NOTE — ED Notes (Signed)
 Stroke book given to pt/family

## 2023-05-04 NOTE — H&P (Signed)
 History and Physical    Alexa Ingram FMW:[id number] DOB: 17-Feb-1941 DOA: 05/04/2023  PCP: [facility] (Confirm with patient/family/NH records and if not entered, this has to be entered at [facility] point of entry) Patient coming from: Home  I have personally briefly reviewed patient's old medical records in [facility]  Chief Complaint: Back pain  HPI: Alexa Ingram is a 82 y.o. female with medical history significant of HTN, non-compliant with home BP meds, brought in by grandson for evaluation of multiple complaints including worsening of left-sided back pain, dysuria, confusion.  Patient stopped taking her BP medication 2 to 3 years ago,  I want to live naturally and mainly use diet and exercise to treat her blood pressure but her blood pressure remains high with most recent reading with SBP more than 200 however patient remained stopping to restarting her BP meds.  About 7 days ago, patient started develop with global headache, worse at daytime, for which she has been taking the as needed OTC pain meds with some relief.  Grandson also noticed the patient sleeping cycle has overturned when she sleeps more at daytime and stay awake at night.  Over the weekend for the 3 to 4 days, patient started develop intermittent confusion, grandson reported that patient occasionally ask about  where is my daughter and in reality her daughter has passed away last year.  And since yesterday patient started to complain about severe sharp-like back pain on the left side, with dysuria, denies any abdominal pain and the flank pain has been localized nonradiating.  Denies any fever or chills.  Patient denied any vision changes no numbness tingling weakness of any of the limbs.  ED Course: Blood pressure significantly elevated with SBP> 220.  CT head showed no acute right internal capsule stroke.  Blood work showed hemoglobin 15.3, WBC 9.5, creatinine 0.9, K3.7, glucose 100.  Brain MRI showed acute  infarct of right thalamus and right posterior limb of internal capsule.  CT abdominal pelvis renal stone study negative for acute findings.  Urine analysis showed leukocytes but negative fornitrites, WBCs 21-50  Patient was started on cefadroxil in the ED.  Review of Systems: As per HPI otherwise 14 point review of systems negative.    History reviewed. No pertinent past medical history.  History reviewed. No pertinent surgical history.   reports that she has never smoked. She has never used smokeless tobacco. She reports that she does not currently use alcohol. She reports that she does not use drugs.  No Known Allergies  History reviewed. No pertinent family history.   Prior to Admission medications   Not on File    Physical Exam: Vitals:   05/03/23 2202 05/04/23 0049 05/04/23 0610 05/04/23 0623  BP: (!) 227/86 (!) 212/89 (!) 238/86   Pulse: 86 78    Resp: 17 17 18    Temp: 98.9 F (37.2 C) 99.3 F (37.4 C)  97.6 F (36.4 C)  TempSrc: Oral Oral  Oral  SpO2: 95% 95%      Constitutional: NAD, calm, comfortable Vitals:   05/03/23 2202 05/04/23 0049 05/04/23 0610 05/04/23 0623  BP: (!) 227/86 (!) 212/89 (!) 238/86   Pulse: 86 78    Resp: 17 17 18    Temp: 98.9 F (37.2 C) 99.3 F (37.4 C)  97.6 F (36.4 C)  TempSrc: Oral Oral  Oral  SpO2: 95% 95%     Eyes: PERRL, lids and conjunctivae normal ENMT: Mucous membranes are moist. Posterior pharynx clear of  any exudate or lesions.Normal dentition.  Neck: normal, supple, no masses, no thyromegaly Respiratory: clear to auscultation bilaterally, no wheezing, no crackles. Normal respiratory effort. No accessory muscle use.  Cardiovascular: Regular rate and rhythm, no murmurs / rubs / gallops. No extremity edema. 2+ pedal pulses. No carotid bruits.  Abdomen: no tenderness, no masses palpated. No hepatosplenomegaly. Bowel sounds positive.  Musculoskeletal: Tenderness on left CVA angle Skin: no rashes, lesions, ulcers. No  induration Neurologic: CN 2-12 grossly intact. Sensation intact, DTR normal. Strength 5/5 in all 4.  Psychiatric: Normal judgment and insight. Alert and oriented x 3. Normal mood.     Labs on Admission: I have personally reviewed following labs and imaging studies  CBC: Recent Labs  Lab 05/03/23 1622  WBC 9.5  NEUTROABS 6.2  HGB 15.3*  HCT 45.4  MCV 90.6  PLT 254   Basic Metabolic Panel: Recent Labs  Lab 05/03/23 1622  NA 140  K 3.7  CL 102  CO2 26  GLUCOSE 102*  BUN 16  CREATININE 0.99  CALCIUM  9.3   GFR: CrCl cannot be calculated (Unknown ideal weight.). Liver Function Tests: Recent Labs  Lab 05/03/23 1622  AST 20  ALT 16  ALKPHOS 79  BILITOT 1.0  PROT 7.2  ALBUMIN 4.1   No results for input(s): LIPASE, AMYLASE in the last 168 hours. No results for input(s): AMMONIA in the last 168 hours. Coagulation Profile: No results for input(s): INR, PROTIME in the last 168 hours. Cardiac Enzymes: No results for input(s): CKTOTAL, CKMB, CKMBINDEX, TROPONINI in the last 168 hours. BNP (last 3 results) No results for input(s): PROBNP in the last 8760 hours. HbA1C: No results for input(s): HGBA1C in the last 72 hours. CBG: No results for input(s): GLUCAP in the last 168 hours. Lipid Profile: No results for input(s): CHOL, HDL, LDLCALC, TRIG, CHOLHDL, LDLDIRECT in the last 72 hours. Thyroid Function Tests: No results for input(s): TSH, T4TOTAL, FREET4, T3FREE, THYROIDAB in the last 72 hours. Anemia Panel: No results for input(s): VITAMINB12, FOLATE, FERRITIN, TIBC, IRON, RETICCTPCT in the last 72 hours. Urine analysis:    Component Value Date/Time   COLORURINE YELLOW (A) 05/03/2023 1622   APPEARANCEUR HAZY (A) 05/03/2023 1622   LABSPEC 1.020 05/03/2023 1622   PHURINE 5.0 05/03/2023 1622   GLUCOSEU NEGATIVE 05/03/2023 1622   HGBUR NEGATIVE 05/03/2023 1622   BILIRUBINUR NEGATIVE 05/03/2023 1622    KETONESUR NEGATIVE 05/03/2023 1622   PROTEINUR 30 (A) 05/03/2023 1622   NITRITE NEGATIVE 05/03/2023 1622   LEUKOCYTESUR MODERATE (A) 05/03/2023 1622    Radiological Exams on Admission: CT Angio Head Neck W WO CM Result Date: 05/04/2023 CLINICAL DATA:  Stroke/TIA, determine embolic source. EXAM: CT ANGIOGRAPHY HEAD AND NECK WITH AND WITHOUT CONTRAST TECHNIQUE: Multidetector CT imaging of the head and neck was performed using the standard protocol during bolus administration of intravenous contrast. Multiplanar CT image reconstructions and MIPs were obtained to evaluate the vascular anatomy. Carotid stenosis measurements (when applicable) are obtained utilizing NASCET criteria, using the distal internal carotid diameter as the denominator. RADIATION DOSE REDUCTION: This exam was performed according to the departmental dose-optimization program which includes automated exposure control, adjustment of the mA and/or kV according to patient size and/or use of iterative reconstruction technique. CONTRAST:  75mL OMNIPAQUE  IOHEXOL  350 MG/ML SOLN COMPARISON:  Head CT 05/03/2023. FINDINGS: CT HEAD FINDINGS Brain: No acute hemorrhage. Acute infarct in the anterolateral aspect of the right thalamus and posterior limb of the right internal capsule. No significant mass effect. Background  of mild chronic small-vessel disease. No new loss of gray-white differentiation. No hydrocephalus or extra-axial collection. No midline shift. Vascular: No hyperdense vessel or unexpected calcification. Skull: No calvarial fracture or suspicious bone lesion. Skull base is unremarkable. Sinuses/Orbits: No acute finding. Other: None. Review of the MIP images confirms the above findings CTA NECK FINDINGS Aortic arch: Three-vessel arch configuration. Atherosclerotic calcifications of the aortic arch and arch vessel origins. Predominantly calcified plaque results in moderate stenosis of the left subclavian artery origin. Right carotid system:  Limited evaluation of the carotid bifurcation due to motion artifact. Within this limitation, mixed plaque results in at least 75% stenosis of the right ICA origin. Left carotid system: Limited evaluation of the carotid bifurcation due to motion artifact. Within this limitation, mixed plaque results in approximately 50% stenosis of the distal left CCA. Vertebral arteries: Right dominant. Limited evaluation of the proximal and mid V2 segments due to motion artifact. Within this limitation, no evidence of occlusion, stenosis, or dissection. Skeleton: Mild cervical spondylosis without high-grade spinal canal stenosis. Other neck: Unremarkable. Upper chest: Unremarkable. Review of the MIP images confirms the above findings CTA HEAD FINDINGS Anterior circulation: Calcified plaque along the carotid siphons without hemodynamically significant stenosis. Moderate stenosis of the distal left A2 segment. Moderate to severe stenosis of the distal right M1 segment. Distal branches are symmetric. Posterior circulation: Diminutive basilar artery. The SCAs and PICAs are patent proximally. The AICAs are not well visualized. Multifocal moderate to severe stenosis of the left PCA at the P1-P2 junction, and within the proximal and mid P2 segment. Severe stenosis of the inferior P3 division of the right PCA. Distal branches are symmetric. Venous sinuses: As permitted by contrast timing, patent. Anatomic variants: Persistent fetal origin of the bilateral PCAs with hypoplastic P1 segments. Review of the MIP images confirms the above findings IMPRESSION: 1. Acute infarct in the anterolateral aspect of the right thalamus and posterior limb of the right internal capsule. No acute hemorrhage. 2. Limited evaluation of the carotid bifurcations due to motion artifact. Within this limitation, mixed plaque results in at least 75% stenosis of the right ICA origin and 50% stenosis of the distal left CCA. 3. Moderate stenosis of the left subclavian  artery origin. 4. Moderate stenosis of the distal left A2 segment. 5. Moderate to severe stenosis of the distal right M1 segment. 6. Multifocal moderate to severe stenosis of the left PCA at the P1-P2 junction, and within the proximal and mid P2 segment. 7. Severe stenosis of the inferior P3 division of the right PCA. Aortic Atherosclerosis (ICD10-I70.0). Electronically Signed   By: Ryan Chess M.D.   On: 05/04/2023 09:33   CT Renal Stone Study Result Date: 05/04/2023 CLINICAL DATA:  One-week history of altered mental status. Right-sided lumbar back pain EXAM: CT ABDOMEN AND PELVIS WITHOUT CONTRAST TECHNIQUE: Multidetector CT imaging of the abdomen and pelvis was performed following the standard protocol without IV contrast. RADIATION DOSE REDUCTION: This exam was performed according to the departmental dose-optimization program which includes automated exposure control, adjustment of the mA and/or kV according to patient size and/or use of iterative reconstruction technique. COMPARISON:  CT abdomen and pelvis dated 10/10/2009 FINDINGS: Lower chest: No focal consolidation or pulmonary nodule in the lung bases. No pleural effusion or pneumothorax demonstrated. Multichamber cardiomegaly. Coronary artery calcifications. Hepatobiliary: No focal hepatic lesions. No intra or extrahepatic biliary ductal dilation. Cholelithiasis. Pancreas: No focal lesions or main ductal dilation. Spleen: Normal in size without focal abnormality. Adrenals/Urinary Tract: No adrenal nodules. No  suspicious renal mass on this noncontrast enhanced examination , calculi or hydronephrosis. No focal bladder wall thickening. Stomach/Bowel: Normal appearance of the stomach. No evidence of bowel wall thickening, distention, or inflammatory changes. Colonic diverticulosis without acute diverticulitis. Normal appendix. Vascular/Lymphatic: Aortic atherosclerosis. No enlarged abdominal or pelvic lymph nodes. Reproductive: No adnexal masses. Other:  No free fluid, fluid collection, or free air. Musculoskeletal: No acute or abnormal lytic or blastic osseous lesions. Multilevel degenerative changes of the partially imaged thoracic and lumbar spine. Small fat-containing paraumbilical hernia. IMPRESSION: 1. No acute abdominopelvic findings. 2. Cholelithiasis without evidence of acute cholecystitis. 3. Colonic diverticulosis without acute diverticulitis. 4. Multichamber cardiomegaly. 5. Aortic Atherosclerosis (ICD10-I70.0). Coronary artery calcifications. Assessment for potential risk factor modification, dietary therapy or pharmacologic therapy may be warranted, if clinically indicated. Electronically Signed   By: Limin  Xu M.D.   On: 05/04/2023 09:28   MR BRAIN WO CONTRAST Result Date: 05/04/2023 CLINICAL DATA:  Neuro deficit, acute, stroke suspected Mental status change, unknown cause. EXAM: MRI HEAD WITHOUT CONTRAST TECHNIQUE: Multiplanar, multiecho pulse sequences of the brain and surrounding structures were obtained without intravenous contrast. COMPARISON:  Head CT 05/03/2023. FINDINGS: Brain: Acute infarct in the anterolateral aspect of the right thalamus and posterior limb of the right internal capsule. No acute hemorrhage or significant mass effect. Background of mild chronic small-vessel disease. Hydrocephalus or extra-axial collection. No midline shift. No foci of abnormal susceptibility. Vascular: Normal flow voids. Skull and upper cervical spine: Normal marrow signal. Sinuses/Orbits: No acute findings. Other: None. IMPRESSION: Acute infarct in the anterolateral aspect of the right thalamus and posterior limb of the right internal capsule. No acute hemorrhage or significant mass effect. Electronically Signed   By: Ryan Chess M.D.   On: 05/04/2023 09:16   CT Head Wo Contrast Result Date: 05/03/2023 CLINICAL DATA:  Confusion. EXAM: CT HEAD WITHOUT CONTRAST TECHNIQUE: Contiguous axial images were obtained from the base of the skull through the  vertex without intravenous contrast. RADIATION DOSE REDUCTION: This exam was performed according to the departmental dose-optimization program which includes automated exposure control, adjustment of the mA and/or kV according to patient size and/or use of iterative reconstruction technique. COMPARISON:  CTA head and neck 05/21/2018 FINDINGS: Brain: A 1.5 cm focus of low-density involving the posterior limb and genu of the right internal capsule is new and suspicious for an acute to subacute infarct. There is an unchanged separate chronic lacunar infarct at the posterior aspect of the right putamen. Hypodensities elsewhere in the cerebral white matter bilaterally are nonspecific but compatible with mild chronic small vessel ischemic disease. No acute cortically based infarct, intracranial hemorrhage, mass, midline shift, or extra-axial fluid collection is identified. The ventricles are normal in size. Vascular: Calcified atherosclerosis at the skull base. No hyperdense vessel. Skull: No acute fracture or suspicious osseous lesion. Sinuses/Orbits: Visualized paranasal sinuses and mastoid air cells are clear. Unremarkable orbits. Other: None. IMPRESSION: 1. Suspected recent infarct in the right internal capsule. 2. Mild chronic small vessel ischemic disease. Electronically Signed   By: Dasie Hamburg M.D.   On: 05/03/2023 16:53    EKG: Pending Assessment/Plan Principal Problem:   Acute CVA (cerebrovascular accident) [facility]) Active Problems:   Acute encephalopathy   Stroke [facility])  (please populate well all problems here in Problem List. (For example, if patient is on BP meds at home and you resume or decide to hold them, it is a problem that needs to be her. Same for CAD, COPD, HLD and so on)  Acute metabolic  encephalopathy -Likely secondary to complicated UTI -Continue ceftriaxone  -Avoid sedation medications  Acute ischemic stroke, right thalamus and right posterior limb of internal capsule -Likely  secondary to untreated HTN.  No obvious corresponding neurological deficit -Probably out of window for permissive hypertension, will start patient on amlodipine , HCTZ/lisinopril .  Add as needed hydralazine  -Review of CT head and neck with showing at least 75% stenosis of right ICA origin.  Discussed with neurology about indication for vascular intervention, and Dr. Matthews recommend outpatient vascular surgery follow-up for elective intervention. -Start aspirin  -Check A1c and lipid panel  HTN emergency -With signs of acute endorgan damage of acute stroke and possibly acute metabolic antibody -BP management as above  Complicated UTI with hematuria -Antibiotics  Acute left-sided back pain -Renal stone ruled out, acute nephritis ruled out -Etiology likely muscular, will try lidocaine  patch  DVT prophylaxis: SCD for now, hold off chemical DVT prophylaxis until BP more controlled for concern about ischemic stroke transferring to hemorrhagic Code Status: Full code Family Communication: Grandson at bedside Disposition Plan: Patient is sick with acute stroke with right ICA stenosis, may need inpatient vascular surgeon intervention, expect more than 2 midnight hospital stay Consults called: Neurology Admission status: Telemetry admission   Cort ONEIDA Mana MD [facility] Pager [phone]  05/04/2023, 10:18 AM

## 2023-05-04 NOTE — Progress Notes (Signed)
 SLP Cancellation Note  Patient Details Name: Alexa Ingram MRN: [id number] DOB: 03-May-1941   Cancelled treatment:       Reason Eval/Treat Not Completed: Fatigue/lethargy limiting ability to participate. Attempted visit for cognitive evaluation. Patient sleeping and roused briefly with stimulus, engaged briefly in conversation, however drowsy and unable to maintain alertness for standardized assessment. SLP to reattempt Thursday.  Ronal Landry [place], MS, CCC-SLP Speech-Language Pathologist [facility]: [phone]    Ronal FORBES [place] 05/04/2023, 3:46 PM

## 2023-05-04 NOTE — Consult Note (Signed)
 NEUROLOGY CONSULT NOTE   Date of service: May 04, 2023 Patient Name: Alexa Ingram MRN:  [id number] DOB:  02/09/1941 Chief Complaint: acute ischemic stroke on MRI Requesting Provider: Laurita Cort DASEN, MD  History of Present Illness   This is a 82 yo woman with pmhx significant for HTN non-compliant with home meds who was BIB grandson for confusion, dysuria and back pain. Her home readings have at times been over 200 systolic however she wanted to live naturally and has not taken blood pressure medication for approximately 2 to 3 years.  About 7 days ago patient began to report a global headache for which she had been taking some OTC pain meds with some relief.  Grandson also noticed the patient's sleeping cycle has become reverse and she sleeps more during the day and tends to stay awake at night.  For the last 3 to 4 days prior to admission she developed intermittent confusion asking about where her daughter was when in reality her daughter passed away last year.  In ED patient was found to have a urinary tract infection and started on cefadroxil.  Blood pressure was significantly elevated with systolic over 220.  CT head was concerning for an acute stroke in the right internal capsule.  Brain MRI confirmed acute infarct of the right thalamus and right posterior limb of the internal capsule.  CNS imaging was personally reviewed.  Patient was well outside the window for thrombolytics given last known well of 3 to 4 days ago.  mRS = 2  NIHSS = 1 for questions  ROS  Comprehensive ROS performed and pertinent positives documented in HPI    Past History  See HPI  History reviewed. No pertinent surgical history.  Family History: History reviewed. No pertinent family history.  Social History  reports that she has never smoked. She has never used smokeless tobacco. She reports that she does not currently use alcohol. She reports that she does not use drugs.  No Known Allergies  Medications    Current Facility-Administered Medications:    [START ON 05/05/2023]  stroke: early stages of recovery book, , Does not apply, Once, Laurita Cort T, MD   acetaminophen  (TYLENOL ) tablet 650 mg, 650 mg, Oral, Q4H PRN **OR** acetaminophen  (TYLENOL ) 160 MG/5ML solution 650 mg, 650 mg, Per Tube, Q4H PRN **OR** acetaminophen  (TYLENOL ) suppository 650 mg, 650 mg, Rectal, Q4H PRN, Laurita Cort T, MD   amLODipine  (NORVASC ) tablet 10 mg, 10 mg, Oral, Daily, Laurita, Ping T, MD, 10 mg at 05/04/23 1008   aspirin  EC tablet 81 mg, 81 mg, Oral, Daily, Laurita Cort T, MD, 81 mg at 05/04/23 1008   [START ON 05/05/2023] cefTRIAXone  (ROCEPHIN ) 1 g in sodium chloride  0.9 % 100 mL IVPB, 1 g, Intravenous, Q24H, Zhang, Ping T, MD   clopidogrel  (PLAVIX ) tablet 75 mg, 75 mg, Oral, Daily, Matthews Barnacle M, MD   hydrALAZINE  (APRESOLINE ) injection 5 mg, 5 mg, Intravenous, Q6H PRN, Laurita Cort T, MD   hydrochlorothiazide  (HYDRODIURIL ) tablet 12.5 mg, 12.5 mg, Oral, Daily, Laurita, Ping T, MD, 12.5 mg at 05/04/23 1008   lidocaine  (LIDODERM ) 5 % 1 patch, 1 patch, Transdermal, Q24H, Laurita Cort T, MD, 1 patch at 05/04/23 1322   lisinopril  (ZESTRIL ) tablet 10 mg, 10 mg, Oral, Daily, Laurita, Ping T, MD, 10 mg at 05/04/23 1008   ondansetron (ZOFRAN) injection 4 mg, 4 mg, Intravenous, Q6H PRN, Laurita Cort T, MD   senna-docusate (Senokot-S) tablet 1 tablet, 1 tablet, Oral, QHS PRN, Laurita Cort T,  MD   sodium chloride  flush (NS) 0.9 % injection 3-10 mL, 3-10 mL, Intravenous, Q12H, Laurita Manor T, MD, 5 mL at 05/04/23 1009   sodium chloride  flush (NS) 0.9 % injection 3-10 mL, 3-10 mL, Intravenous, PRN, Laurita Manor DASEN, MD No current outpatient medications on file.  Vitals   Vitals:   05/04/23 0623 05/04/23 1027 05/04/23 1111 05/04/23 1325  BP:  (!) 182/89 (!) 174/69 (!) 149/86  Pulse:  78 74 77  Resp:  16 20 16   Temp: 97.6 F (36.4 C) 98 F (36.7 C)  (!) 97.5 F (36.4 C)  TempSrc: Oral Oral  Oral  SpO2:  95% 94% 97%    There is no  height or weight on file to calculate BMI.  Physical Exam   Gen: patient lying in bed, NAD CV: extremities appear well-perfused Resp: normal WOB  Neurologic exam MS: alert, oriented x2, follows commands Speech: no dysarthria, no aphasia CN: PERRL, VFF, EOMI, sensation intact, face symmetric, hearing intact to voice Motor: 5/5 strength throughout Sensory: SILT Reflexes: 2+ symm with toes down bilat Coordination: FNF intact bilat Gait: deferred   Labs/Imaging/Neurodiagnostic studies   CBC:  Recent Labs  Lab May 10, 2023 1622  WBC 9.5  NEUTROABS 6.2  HGB 15.3*  HCT 45.4  MCV 90.6  PLT 254   Basic Metabolic Panel:  Lab Results  Component Value Date   NA 140 05-10-23   K 3.7 05/10/2023   CO2 26 2023-05-10   GLUCOSE 102 (H) 2023/05/10   BUN 16 05/10/23   CREATININE 0.99 05-10-23   CALCIUM  9.3 05/10/2023   GFRNONAA 56 (L) 05-10-23   Lipid Panel: No results found for: LDLCALC HgbA1c: No results found for: HGBA1C Urine Drug Screen: No results found for: LABOPIA, COCAINSCRNUR, LABBENZ, AMPHETMU, THCU, LABBARB  Alcohol Level No results found for: ETH INR No results found for: INR APTT No results found for: APTT AED levels: No results found for: PHENYTOIN, ZONISAMIDE, LAMOTRIGINE, LEVETIRACETA  CT Head without contrast(Personally reviewed): 1. Suspected recent infarct in the right internal capsule. 2. Mild chronic small vessel ischemic disease.  CT angio Head and Neck with contrast(Personally reviewed): 1. Acute infarct in the anterolateral aspect of the right thalamus and posterior limb of the right internal capsule. No acute hemorrhage. 2. Limited evaluation of the carotid bifurcations due to motion artifact. Within this limitation, mixed plaque results in at least 75% stenosis of the right ICA origin and 50% stenosis of the distal left CCA. 3. Moderate stenosis of the left subclavian artery origin. 4. Moderate stenosis of the  distal left A2 segment. 5. Moderate to severe stenosis of the distal right M1 segment. 6. Multifocal moderate to severe stenosis of the left PCA at the P1-P2 junction, and within the proximal and mid P2 segment. 7. Severe stenosis of the inferior P3 division of the right PCA.  MRI Brain(Personally reviewed): Acute infarct in the anterolateral aspect of the right thalamus and posterior limb of the right internal capsule. No acute hemorrhage or significant mass effect.   ASSESSMENT   This is a 82 yo woman with pmhx significant for HTN non-compliant with home meds who was BIB grandson for confusion in the setting of SBP > 200, R thalamic stroke, and UTI. Etiology of stroke likely small vessel. Outside the window for TNK or permissive HTN.  RECOMMENDATIONS   - Outside the window for permissive HTN, goal normotension, avoid hypotension - TTE  - Check A1c and LDL + add statin per guidelines - ASA 81mg   daily + plavix  75mg  daily x90 days f/b ASA 81mg  daily monotherapy after that 2/2 severe intracranial stenosis - referral to vascular surgery outpatient for consideration of R ICA revascularization - q4 hr neuro checks - STAT head CT for any change in neuro exam - Tele - PT/OT/SLP - Stroke education - Amb referral to neurology upon discharge   ______________________________________________________________________    Signed, Elida CHRISTELLA Ross, MD [facility]

## 2023-05-04 NOTE — Evaluation (Signed)
 Occupational Therapy Evaluation Patient Details Name: Alexa Ingram MRN: [id number] DOB: 31-Aug-1940 Today's Date: 05/04/2023   History of Present Illness Alexa Ingram is a 82 y.o. female with medical history significant of HTN, non-compliant with home BP meds, brought in by grandson for evaluation of multiple complaints including worsening of left-sided back pain, dysuria, confusion.     Patient stopped taking her BP medication 2 to 3 years ago,  I want to live naturally and mainly use diet and exercise to treat her blood pressure but her blood pressure remains high with most recent reading with SBP more than 200 however patient remained stopping to restarting her BP meds.  About 7 days ago, patient started develop with global headache, worse at daytime, for which she has been taking the as needed OTC pain meds with some relief.  Grandson also noticed the patient sleeping cycle has overturned when she sleeps more at daytime and stay awake at night.  Over the weekend for the 3 to 4 days, patient started develop intermittent confusion, grandson reported that patient occasionally ask about  where is my daughter and in reality her daughter has passed away last year.  And since yesterday patient started to complain about severe sharp-like back pain on the left side, with dysuria, denies any abdominal pain and the flank pain has been localized nonradiating.  Denies any fever or chills.  Patient denied any vision changes no numbness tingling weakness of any of the limbs.   Clinical Impression   Patient received for OT evaluation. See flowsheet below for details of function. Generally, patient requiring SBA for bed mobility, CGA-MIN A for functional mobility, and Supervision/SBA for ADLs. Pt would benefit from standardized cognitive assessment next session to ensure safety with IADLs and medication management. Patient will benefit from continued OT while in acute care.       If plan is discharge home,  recommend the following: A little help with walking and/or transfers;A little help with bathing/dressing/bathroom;Assistance with cooking/housework;Direct supervision/assist for financial management;Direct supervision/assist for medications management;Help with stairs or ramp for entrance    Functional Status Assessment  Patient has had a recent decline in their functional status and demonstrates the ability to make significant improvements in function in a reasonable and predictable amount of time.  Equipment Recommendations  Tub/shower seat    Recommendations for Other Services       Precautions / Restrictions Precautions Precautions: Fall Restrictions Weight Bearing Restrictions Per Provider Order: No      Mobility Bed Mobility Overal bed mobility: Modified Independent             General bed mobility comments: difficulty from high stretcher, but able to t/f.    Transfers Overall transfer level: Needs assistance Equipment used: 1 person hand held assist Transfers: Sit to/from Stand Sit to Stand: Supervision           General transfer comment: Mobilized around the unit with CGA and gait belt; two losses of balance corrected; pt needing cues to realize she had loss of balance.      Balance Overall balance assessment: Needs assistance Sitting-balance support: No upper extremity supported Sitting balance-Leahy Scale: Good     Standing balance support: During functional activity Standing balance-Leahy Scale: Fair Standing balance comment: two losses of balance to the R during mobility                           ADL either performed or assessed with clinical  judgement   ADL Overall ADL's : Needs assistance/impaired                                       General ADL Comments: patient able to make figure four position while seated. Anticipate MOD (I) for most ADLs, but concern for balance, as pt had two losses of balance while  mobilizing. Would benefit from SBA intially upon d/c for safety, and use of shower chair and RW.     Vision Baseline Vision/History: 1 Wears glasses Patient Visual Report: No change from baseline       Perception         Praxis         Pertinent Vitals/Pain Pain Assessment Pain Assessment: No/denies pain     Extremity/Trunk Assessment Upper Extremity Assessment Upper Extremity Assessment: Overall WFL for tasks assessed;Right hand dominant (strength, ROM, sensation, coordination all WNL and symmetrical bilaterally)   Lower Extremity Assessment Lower Extremity Assessment: Defer to PT evaluation       Communication     Cognition Arousal: Alert Behavior During Therapy: WFL for tasks assessed/performed Overall Cognitive Status: No family/caregiver present to determine baseline cognitive functioning                                 General Comments: Pt able to state the date within one day, although PT had corrected pt on the date a few minutes before OT arrival and pt was unable to carry that over. Would benefit from standardized cognitive screen to further assess cognitive deficits. Friendly and cooperative during session. Resistant to recommendation for trying RW; decreased insight into deficits.     General Comments  On room air. BP 180/77 seated at edge of stretcher, feet unsupported; 98% O2 on room air. No dizziness noted during session.    Exercises     Shoulder Instructions      Home Living Family/patient expects to be discharged to:: Private residence Living Arrangements: Alone Available Help at Discharge: Family;Available PRN/intermittently (grandson lives nearby) Type of Home: Mobile home Home Access: Stairs to enter Entrance Stairs-Number of Steps: 5 Entrance Stairs-Rails: Right;Left Home Layout: One level     Bathroom Shower/Tub: Chief Strategy Officer: Handicapped height                Prior Functioning/Environment  Prior Level of Function : Independent/Modified Independent;Driving             Mobility Comments: No AD; denies falls. ADLs Comments: (I) ADL/IADL. Driving. Enjoys playing [employer] card game with her grandchildren.        OT Problem List: Impaired balance (sitting and/or standing)      OT Treatment/Interventions: Self-care/ADL training;Therapeutic exercise;Therapeutic activities;DME and/or AE instruction    OT Goals(Current goals can be found in the care plan section) Acute Rehab OT Goals Patient Stated Goal: Go home OT Goal Formulation: With patient Time For Goal Achievement: 05/18/23 Potential to Achieve Goals: Good ADL Goals Additional ADL Goal #1: Patient will perform IADL tasks MOD (I) with LRAD by d/c.  OT Frequency: Min 1X/week    Co-evaluation PT/OT/SLP Co-Evaluation/Treatment: Yes Reason for Co-Treatment: For patient/therapist safety   OT goals addressed during session: ADL's and self-care      AM-PAC OT 6 Clicks Daily Activity     Outcome Measure Help from another person eating meals?:  None Help from another person taking care of personal grooming?: None Help from another person toileting, which includes using toliet, bedpan, or urinal?: None Help from another person bathing (including washing, rinsing, drying)?: A Little Help from another person to put on and taking off regular upper body clothing?: None Help from another person to put on and taking off regular lower body clothing?: None 6 Click Score: 23   End of Session Equipment Utilized During Treatment: Gait belt Nurse Communication: Mobility status  Activity Tolerance: Patient tolerated treatment well Patient left: in bed;with call bell/phone within reach  OT Visit Diagnosis: Unsteadiness on feet (R26.81)                Time: 8642-8588 OT Time Calculation (min): 14 min Charges:  OT General Charges $OT Visit: 1 Visit OT Evaluation $OT Eval Moderate Complexity: 1 Mod  Merryl Buckels Arlean Shams, MS,  OTR/L  Jasmine Shams 05/04/2023, 2:48 PM

## 2023-05-04 NOTE — Evaluation (Signed)
 Physical Therapy Evaluation Patient Details Name: Alexa Ingram MRN: [id number] DOB: 12-26-1940 Today's Date: 05/04/2023  History of Present Illness  Pt  is an 82 yo female that presented to the ED for L sided back pain, dysuria, confusion. MRI showed acute infarct of right thalamus and right posterior limb of internal capsule.  Clinical Impression  Pt alert, oriented to hospital in [place], herself, the month/year but did display some decreased insights of deficits and safety.Able to follow all commands well. Upon assessment BLE strength symmetrical and WFLs (pt with some difficulty understanding MMT task), reported sensation WFLs and demonstrated coordination WFLs. modI for bed mobility, and supervision for transfers due to elevated surface. She was able to ambulate without AD ~26ft but did have two LOB that needed minA to correct, as well as difficulty with higher level balance activities (would benefit from DGI next session). Pt needed cueing to understand current balance challenges like LOB she experienced, but resistant to RW recommendation.  Overall the patient demonstrated deficits (see PT Problem List) that impede the patient's functional abilities, safety, and mobility and would benefit from skilled PT intervention.          If plan is discharge home, recommend the following: A little help with walking and/or transfers;Direct supervision/assist for medications management;Help with stairs or ramp for entrance;Assist for transportation   Can travel by private vehicle        Equipment Recommendations Rolling walker (2 wheels)  Recommendations for Other Services       Functional Status Assessment Patient has had a recent decline in their functional status and demonstrates the ability to make significant improvements in function in a reasonable and predictable amount of time.     Precautions / Restrictions Precautions Precautions: Fall Restrictions Weight Bearing Restrictions  Per Provider Order: No      Mobility  Bed Mobility Overal bed mobility: Modified Independent                  Transfers Overall transfer level: Needs assistance Equipment used: 1 person hand held assist Transfers: Sit to/from Stand Sit to Stand: Supervision                Ambulation/Gait Ambulation/Gait assistance: Contact guard assist, Min assist Gait Distance (Feet): 200 Feet Assistive device: None         General Gait Details: 2 LOB noted with head turns, minA to correct and pt needed explanation/attention drawn to LOB. resistant to DME conversation  Stairs            Wheelchair Mobility     Tilt Bed    Modified Rankin (Stroke Patients Only)       Balance Overall balance assessment: Needs assistance Sitting-balance support: No upper extremity supported Sitting balance-Leahy Scale: Good     Standing balance support: During functional activity Standing balance-Leahy Scale: Good               High level balance activites: Turns, Sudden stops, Head turns High Level Balance Comments: challenged by higher level tasks. 2 LOB. would benefit from true BERG/DGI to assess deficits             Pertinent Vitals/Pain Pain Assessment Pain Assessment: No/denies pain    Home Living Family/patient expects to be discharged to:: Private residence Living Arrangements: Alone Available Help at Discharge: Family;Available PRN/intermittently (grandson) Type of Home: Mobile home Home Access: Stairs to enter Entrance Stairs-Rails: Right;Left Entrance Stairs-Number of Steps: 5   Home Layout: One level Home Equipment: None  Prior Function Prior Level of Function : Independent/Modified Independent;Driving             Mobility Comments: No AD; denies falls. ADLs Comments: (I) ADL/IADL. Driving. Enjoys playing [employer] card game with her grandchildren.     Extremity/Trunk Assessment   Upper Extremity Assessment Upper Extremity  Assessment: Defer to OT evaluation    Lower Extremity Assessment Lower Extremity Assessment: Overall WFL for tasks assessed (sensation, coordination intact, strength bilaterally 4/5)    Cervical / Trunk Assessment Cervical / Trunk Assessment: Normal  Communication      Cognition Arousal: Alert Behavior During Therapy: WFL for tasks assessed/performed Overall Cognitive Status: No family/caregiver present to determine baseline cognitive functioning                                 General Comments: Pt able to state the date within one day, although PT had corrected pt on the date a few minutes before OT arrival and pt was unable to carry that over. Would benefit from standardized cognitive screen to further assess cognitive deficits. Friendly and cooperative during session. Resistant to recommendation for trying RW; decreased insight into deficits.        General Comments General comments (skin integrity, edema, etc.): On room air. BP 180/77 seated at edge of stretcher, feet unsupported; 98% O2 on room air. No dizziness noted during session.    Exercises     Assessment/Plan    PT Assessment Patient needs continued PT services  PT Problem List Decreased activity tolerance;Decreased balance;Decreased mobility;Decreased safety awareness       PT Treatment Interventions DME instruction;Balance training;Neuromuscular re-education;Stair training;Gait training;Functional mobility training;Patient/family education;Therapeutic activities;Therapeutic exercise    PT Goals (Current goals can be found in the Care Plan section)  Acute Rehab PT Goals Patient Stated Goal: to go home PT Goal Formulation: With patient Time For Goal Achievement: 05/18/23 Potential to Achieve Goals: Good    Frequency Min 1X/week     Co-evaluation PT/OT/SLP Co-Evaluation/Treatment: Yes Reason for Co-Treatment: For patient/therapist safety PT goals addressed during session: Mobility/safety with  mobility;Balance OT goals addressed during session: ADL's and self-care       AM-PAC PT 6 Clicks Mobility  Outcome Measure Help needed turning from your back to your side while in a flat bed without using bedrails?: None Help needed moving from lying on your back to sitting on the side of a flat bed without using bedrails?: None Help needed moving to and from a bed to a chair (including a wheelchair)?: None Help needed standing up from a chair using your arms (e.g., wheelchair or bedside chair)?: None Help needed to walk in hospital room?: A Little Help needed climbing 3-5 steps with a railing? : A Little 6 Click Score: 22    End of Session Equipment Utilized During Treatment: Gait belt Activity Tolerance: Patient tolerated treatment well Patient left: in bed;with call bell/phone within reach Nurse Communication: Mobility status PT Visit Diagnosis: Other abnormalities of gait and mobility (R26.89);Difficulty in walking, not elsewhere classified (R26.2)    Time: 8647-8588 PT Time Calculation (min) (ACUTE ONLY): 19 min   Charges:   PT Evaluation $PT Eval Low Complexity: 1 Low PT Treatments $Therapeutic Activity: 8-22 mins PT General Charges $$ ACUTE PT VISIT: 1 Visit        Doyal Shams PT, DPT 4:13 PM,05/04/23

## 2023-05-04 NOTE — ED Provider Notes (Signed)
 [facility] Provider Note    Event Date/Time   First MD Initiated Contact with Patient 05/04/23 [phone]     (approximate)   History   Back Pain   HPI  Alexa Ingram is a 82 y.o. female   Past medical history of hypertension but has not been taking her antihypertensives for over 1 year as she is attempting to control her blood pressure with lifestyle changes only.  She lives alone.  She has had 1 week of right sided lumbar back strain/soreness with no trauma or obvious inciting event no urinary symptoms.  However she is here in the emergency department today due to altered mental status noted by her family members over the last 1 week.  She is more confused than usual, disoriented about time of day, slower to respond than her normal sharp self.  She denies any trauma.  She denies any fevers or chills.  She denies any chest pain or shortness of breath.  She has no respiratory infectious symptoms, GI or GU symptoms.  Her only chief complaint is that she has right sided paraspinal lower back soreness.  She admits that she is more confused than normal.  Independent Historian contributed to assessment above: Her grandson is at bedside to corroborate information past medical history as above    Physical Exam   Triage Vital Signs: ED Triage Vitals  Encounter Vitals Group     BP 05/03/23 1619 (!) 241/84     Systolic BP Percentile --      Diastolic BP Percentile --      Pulse Rate 05/03/23 1619 97     Resp 05/03/23 1619 18     Temp 05/03/23 1619 99 F (37.2 C)     Temp Source 05/03/23 1619 Oral     SpO2 05/03/23 1619 95 %     Weight --      Height --      Head Circumference --      Peak Flow --      Pain Score 05/03/23 1617 10     Pain Loc --      Pain Education --      Exclude from Growth Chart --     Most recent vital signs: Vitals:   05/04/23 0049 05/04/23 0610  BP: (!) 212/89 (!) 238/86  Pulse: 78   Resp: 17 18  Temp: 99.3 F (37.4 C)   SpO2:  95%     General: Awake, no distress.  CV:  Good peripheral perfusion.  Resp:  Normal effort.  Abd:  No distention.  Other:  Hypertensive over 200 systolic.  No facial asymmetry, dysarthria, or motor or sensory deficits and finger-to-nose is normal.  She is at times confused, speaking inappropriately, but when asked orientation questions she is able to answer correctly albeit with a long delay to think.  She has right paraspinal soreness to palpation in the right lumbar area, and a soft benign abdominal exam and clear lungs.   ED Results / Procedures / Treatments   Labs (all labs ordered are listed, but only abnormal results are displayed) Labs Reviewed  CBC WITH DIFFERENTIAL/PLATELET - Abnormal; Notable for the following components:      Result Value   Hemoglobin 15.3 (*)    All other components within normal limits  COMPREHENSIVE METABOLIC PANEL - Abnormal; Notable for the following components:   Glucose, Bld 102 (*)    GFR, Estimated 56 (*)    All other components within normal  limits  URINALYSIS, ROUTINE W REFLEX MICROSCOPIC - Abnormal; Notable for the following components:   Color, Urine YELLOW (*)    APPearance HAZY (*)    Protein, ur 30 (*)    Leukocytes,Ua MODERATE (*)    Bacteria, UA RARE (*)    Non Squamous Epithelial PRESENT (*)    All other components within normal limits     I ordered and reviewed the above labs they are notable for her urinalysis shows rare bacteria and moderate leukocytes.   RADIOLOGY I independently reviewed and interpreted CT of the head and see no obvious bleeding or midline shift I also reviewed radiologist's formal read.   PROCEDURES:  Critical Care performed: Yes, see critical care procedure note(s)  .Critical Care  Performed by: Cyrena Mylar, MD Authorized by: Cyrena Mylar, MD   Critical care provider statement:    Critical care time (minutes):  30   Critical care was time spent personally by me on the following activities:   Development of treatment plan with patient or surrogate, discussions with consultants, evaluation of patient's response to treatment, examination of patient, ordering and review of laboratory studies, ordering and review of radiographic studies, ordering and performing treatments and interventions, pulse oximetry, re-evaluation of patient's condition and review of old charts    MEDICATIONS ORDERED IN ED: Medications  cefTRIAXone  (ROCEPHIN ) 1 g in sodium chloride  0.9 % 100 mL IVPB (has no administration in time range)    External physician / consultants:  I spoke with hospital medicine for admission and regarding care plan for this patient.   IMPRESSION / MDM / ASSESSMENT AND PLAN / ED COURSE  I reviewed the triage vital signs and the nursing notes.                                Patient's presentation is most consistent with acute presentation with potential threat to life or bodily function.  Differential diagnosis includes, but is not limited to, infection, metabolic derangement, CVA   The patient is on the cardiac monitor to evaluate for evidence of arrhythmia and/or significant heart rate changes.  MDM:    This is a patient with altered mental status for 1 week as well as right lower back pain atraumatic.  Otherwise no focal symptoms and well-appearing patient despite some solutions to respond and confusion.  Her CT scan of the head shows a subacute infarct of the right internal capsule.  She has no focal neurologic deficits on full neurologic exam though she is altered from baseline.  Given findings consistent with timeline of altered mental status, I am concerned she is having a stroke in the setting of uncontrolled hypertension, will allow permissive hypertension for the time being and get stroke workup including MRI and CT angiogram of the head and neck.  Outside of the window for thrombolytic.  She has some flank pain as well as a urinalysis with bacteria and leukocytes  which may represent an infection.  Given IV Rocephin . Check CT renal for stone.   Admission.       FINAL CLINICAL IMPRESSION(S) / ED DIAGNOSES   Final diagnoses:  Right flank pain  Cerebrovascular accident (CVA), unspecified mechanism (HCC)  Bacteriuria     Rx / DC Orders   ED Discharge Orders     None        Note:  This document was prepared using Dragon voice recognition software and may include unintentional dictation errors.  Cyrena Mylar, MD 05/04/23 [phone]

## 2023-05-04 NOTE — Progress Notes (Signed)
*  PRELIMINARY RESULTS* Echocardiogram 2D Echocardiogram has been performed.  Carolyne Fiscal 05/04/2023, 3:44 PM

## 2023-05-04 NOTE — ED Notes (Signed)
Back from MRI  Pt is very sleepy from IV medication  but wil arouse easily

## 2023-05-05 DIAGNOSIS — I639 Cerebral infarction, unspecified: Secondary | ICD-10-CM | POA: Diagnosis not present

## 2023-05-05 LAB — LIPID PANEL
Cholesterol: 228 mg/dL — ABNORMAL HIGH (ref 0–200)
HDL: 55 mg/dL (ref >40)
LDL Cholesterol: 149 mg/dL — ABNORMAL HIGH (ref 0–99)
Total CHOL/HDL Ratio: 4.1 {ratio}
Triglycerides: 121 mg/dL (ref <150)
VLDL: 24 mg/dL (ref 0–40)

## 2023-05-05 LAB — TSH: TSH: 1.466 u[IU]/mL (ref 0.350–4.500)

## 2023-05-05 MED ORDER — ATORVASTATIN CALCIUM 80 MG PO TABS: 80.0000 mg | ORAL_TABLET | Freq: Every day | ORAL | 11 refills | Status: DC

## 2023-05-05 MED ORDER — ASPIRIN 81 MG PO TBEC: 81.0000 mg | DELAYED_RELEASE_TABLET | Freq: Every day | ORAL | 11 refills | Status: DC

## 2023-05-05 MED ORDER — CLOPIDOGREL BISULFATE 75 MG PO TABS: 75.0000 mg | ORAL_TABLET | Freq: Every day | ORAL | 0 refills | Status: DC

## 2023-05-05 MED ORDER — LISINOPRIL 10 MG PO TABS: 10.0000 mg | ORAL_TABLET | Freq: Every day | ORAL | 11 refills | Status: DC

## 2023-05-05 MED ORDER — AMLODIPINE BESYLATE 10 MG PO TABS: 10.0000 mg | ORAL_TABLET | Freq: Every day | ORAL | 11 refills | Status: DC

## 2023-05-05 MED ORDER — ACETAMINOPHEN 325 MG PO TABS: 650.0000 mg | ORAL_TABLET | Freq: Three times a day (TID) | ORAL | Status: DC | PRN

## 2023-05-05 MED ORDER — ATORVASTATIN CALCIUM 80 MG PO TABS
80.0000 mg | ORAL_TABLET | Freq: Every day | ORAL | Status: DC
  Filled 2023-05-05: qty 1

## 2023-05-05 MED ORDER — AMOXICILLIN-POT CLAVULANATE 875-125 MG PO TABS: 1.0000 | ORAL_TABLET | Freq: Two times a day (BID) | ORAL | 0 refills | Status: DC

## 2023-05-05 MED ORDER — ACETAMINOPHEN 325 MG PO TABS: 650.0000 mg | ORAL_TABLET | Freq: Three times a day (TID) | ORAL | Status: AC | PRN

## 2023-05-05 NOTE — Plan of Care (Signed)

## 2023-05-05 NOTE — TOC Transition Note (Signed)
 Transition of Care [facility]) - Discharge Note   Patient Details  Name: Alexa Ingram MRN: [id number] Date of Birth: Oct 05, 1940  Transition of Care [facility]) CM/SW Contact:  Ladene Lady, LCSW Phone Number: 05/05/2023, 11:53 AM   Clinical Narrative:   Pt to discharge home with [facility] CSW has ordered a RW to be delivered to room through [employer]. Larraine with [facility] has accepted the referral for [facility] PT/OT/RN.    Final next level of care: Home w Home Health Services Barriers to Discharge: Continued Medical Work up   Patient Goals and CMS Choice            Discharge Placement                       Discharge Plan and Services Additional resources added to the After Visit Summary for                  DME Arranged: Walker rolling DME Agency: [employer] Date DME Agency Contacted: 05/05/23     HH Arranged: RN, PT, OT HH Agency: [facility] Date HH Agency Contacted: 05/05/23   Representative spoke with at [facility] Agency: larraine  Social Drivers of Health (SDOH) Interventions SDOH Screenings   Food Insecurity: No Food Insecurity (05/04/2023)  Housing: Low Risk  (05/04/2023)  Transportation Needs: No Transportation Needs (05/04/2023)  Utilities: Not At Risk (05/04/2023)  Social Connections: Socially Isolated (05/04/2023)  Tobacco Use: Low Risk  (05/04/2023)     Readmission Risk Interventions     No data to display

## 2023-05-05 NOTE — Progress Notes (Signed)
 No LOB while ambulating with Youth height RW in halls with changes in directions and negotiation around objects. Pt encouraged to utilize RW upon d/c until strength and balance improves. Pt cleared on stair negotiation with use of B rails, no buckling or LOB noted. Pt appears safe to return home with assistance. Requested adult RW that was previously delivered to be exchanged for youth height to facilitate better support form AD for balance.    05/05/23 1600  PT Visit Information  Assistance Needed +1  History of Present Illness Pt  is an 83 yo female that presented to the ED for L sided back pain, dysuria, confusion. MRI showed acute infarct of right thalamus and right posterior limb of internal capsule.  Subjective Data  Patient Stated Goal to go home  Precautions  Precautions Fall  Restrictions  Weight Bearing Restrictions Per Provider Order No  Pain Assessment  Pain Assessment No/denies pain  Cognition  Arousal Alert  Behavior During Therapy [facility] for tasks assessed/performed  Overall Cognitive Status No family/caregiver present to determine baseline cognitive functioning  General Comments Would benefit from standardized cognitive screen to further assess cognitive deficits. Friendly and cooperative during session. Resistant to recommendation for trying RW; decreased insight into deficits.  Bed Mobility  Overal bed mobility Modified Independent  Transfers  Overall transfer level Needs assistance  Equipment used 1 person hand held assist  Transfers Sit to/from Stand  Sit to Stand Supervision  General transfer comment Able to stand on first attempt  Ambulation/Gait  Ambulation/Gait assistance Contact guard assist  Gait Distance (Feet) 200 Feet  Assistive device Rolling walker (2 wheels) (YOUTH)  Gait Pattern/deviations Step-through pattern;Narrow base of support  General Gait Details No LOB noted this session  Stairs Yes  Stairs assistance Supervision;Contact guard assist  Stair  Management Two rails;Alternating pattern  Number of Stairs 4  General stair comments Good demonstration of ability to negotiate steps  Balance  Overall balance assessment Needs assistance  Sitting-balance support No upper extremity supported  Sitting balance-Leahy Scale Good  Standing balance support During functional activity  Standing balance-Leahy Scale Fair  Standing balance comment Improved steadiness with use of youth height RW  PT - End of Session  Equipment Utilized During Treatment Gait belt  Activity Tolerance Patient tolerated treatment well  Patient left in bed;with call bell/phone within reach  Nurse Communication Mobility status   PT - Assessment/Plan  PT Visit Diagnosis Other abnormalities of gait and mobility (R26.89);Difficulty in walking, not elsewhere classified (R26.2)  PT Frequency (ACUTE ONLY) Min 1X/week  Follow Up Recommendations Home health PT  Patient can return home with the following A little help with walking and/or transfers;Direct supervision/assist for medications management;Help with stairs or ramp for entrance;Assist for transportation  PT equipment Rolling walker (2 wheels);Other (comment) (YOUTH)  AM-PAC PT 6 Clicks Mobility Outcome Measure (Version 2)  Help needed turning from your back to your side while in a flat bed without using bedrails? 4  Help needed moving from lying on your back to sitting on the side of a flat bed without using bedrails? 4  Help needed moving to and from a bed to a chair (including a wheelchair)? 4  Help needed standing up from a chair using your arms (e.g., wheelchair or bedside chair)? 4  Help needed to walk in hospital room? 3  Help needed climbing 3-5 steps with a railing?  3  6 Click Score 22  Consider Recommendation of Discharge To: Home with no services  Progressive Mobility  What is the highest level of mobility based on the progressive mobility assessment? Level 5 (Walks with assist in room/hall) - Balance  while stepping forward/back and can walk in room with assist - Complete  Mobility Referral No  Activity Ambulated with assistance in hallway  PT Goal Progression  Progress towards PT goals Progressing toward goals  PT Time Calculation  PT Start Time (ACUTE ONLY) 1450  PT Stop Time (ACUTE ONLY) 1506  PT Time Calculation (min) (ACUTE ONLY) 16 min  PT General Charges  $$ ACUTE PT VISIT 1 Visit  PT Treatments  $Gait Training 8-22 mins   Darice Bohr, PTA 05/05/2023

## 2023-05-05 NOTE — TOC Initial Note (Signed)
 Transition of Care [facility]) - Initial/Assessment Note    Patient Details  Name: Alexa Ingram MRN: [id number] Date of Birth: [birth year]  Transition of Care [facility]) CM/SW Contact:    Ladene Lady, LCSW Phone Number: 05/05/2023, 10:59 AM  Clinical Narrative:   Pt admitted with CVA. Spoke with grandson he states pt was driving before being admitted and was independent other than walking with a cane. He would like to set [facility] services and does not have a preference on agency. He would like a RW ordered. He asked about additional help for her as he stated he thinks she has medicaid. CSW spoke with him about PCS services through medicaid that she could get through her primary. Darden will follow up with this. Darden states he is the main source of support for her since her daughter had passed away.          Expected Discharge Plan: Home w Home Health Services Barriers to Discharge: Continued Medical Work up   Patient Goals and CMS Choice            Expected Discharge Plan and Services       Living arrangements for the past 2 months: Single Family Home                                      Prior Living Arrangements/Services Living arrangements for the past 2 months: Single Family Home Lives with:: Self   Do you feel safe going back to the place where you live?: Yes               Activities of Daily Living   ADL Screening (condition at time of admission) Independently performs ADLs?: Yes (appropriate for developmental age) Is the patient deaf or have difficulty hearing?: No Does the patient have difficulty seeing, even when wearing glasses/contacts?: No Does the patient have difficulty concentrating, remembering, or making decisions?: No  Permission Sought/Granted                  Emotional Assessment       Orientation: : Fluctuating Orientation (Suspected and/or reported Sundowners)      Admission diagnosis:  Stroke [facility]) [I63.9] Bacteriuria  [R82.71] Right flank pain [R10.9] Acute CVA (cerebrovascular accident) (HCC) [I63.9] Cerebrovascular accident (CVA), unspecified mechanism (HCC) [I63.9] Patient Active Problem List   Diagnosis Date Noted   Acute CVA (cerebrovascular accident) (HCC) 05/04/2023   Acute encephalopathy 05/04/2023   Stroke (HCC) 05/04/2023   PCP:  [facility] Pharmacy:  No Pharmacies Listed    Social Drivers of Health (SDOH) Social History: SDOH Screenings   Food Insecurity: No Food Insecurity (05/04/2023)  Housing: Low Risk  (05/04/2023)  Transportation Needs: No Transportation Needs (05/04/2023)  Utilities: Not At Risk (05/04/2023)  Social Connections: Socially Isolated (05/04/2023)  Tobacco Use: Low Risk  (05/04/2023)   SDOH Interventions:     Readmission Risk Interventions     No data to display

## 2023-05-05 NOTE — Discharge Summary (Addendum)
 [facility] Discharge Summary   Patient: Alexa Ingram FMW:[id number]  PCP: [facility]  Date of admission: 05/04/2023   Date of discharge: 05/05/2023 05/06/2023     Discharge Diagnoses:  Principal Problem:   Acute CVA (cerebrovascular accident) [facility]) Active Problems:   Acute encephalopathy   Stroke [facility])   Admitted From: Home Disposition:  Home with [facility] services  Recommendations for Outpatient Follow-up:  Follow with PCP in 1 week, continue to monitor BP at home and follow with PCP to titrate medication accordingly. Follow-up with cardiology for Zio patch for 2 weeks to rule out occult arrhythmias. Follow-up with neurology in 1 to 2 weeks Follow-up with vascular surgery for right carotid stenosis. Follow up LABS/TEST:     Follow-up Information     Schnier, Cordella MATSU, MD Follow up in 1 month(s).   Specialties: Vascular Surgery, Cardiology, Radiology, Vascular Surgery Contact information: [address] [place] [address] [phone]         Florencio Cara BIRCH, MD Follow up in 1 week(s).   Specialties: Cardiology, Internal Medicine Why: for cardiac monitor Zio patch due to stroke Contact information: [address] [place] [address] [phone]                Diet recommendation: Cardiac diet  Activity: The patient is advised to gradually reintroduce usual activities, as tolerated  Discharge Condition: stable  Code Status: Full code   History of present illness: As per the H and P dictated on admission Hospital Course:  Alexa Ingram is a 83 y.o. female with medical history significant of HTN, non-compliant with home BP meds, brought in by grandson for evaluation of multiple complaints including worsening of left-sided back pain, dysuria, confusion.   Patient stopped taking her BP medication 2 to 3 years ago,  I want to live naturally and mainly use diet and exercise to treat her blood pressure but  her blood pressure remains high with most recent reading with SBP more than 200 however patient remained stopping to restarting her BP meds.  About 7 days ago, patient started develop with global headache, worse at daytime, for which she has been taking the as needed OTC pain meds with some relief.  Grandson also noticed the patient sleeping cycle has overturned when she sleeps more at daytime and stay awake at night.  Over the weekend for the 3 to 4 days, patient started develop intermittent confusion, grandson reported that patient occasionally ask about  where is my daughter and in reality her daughter has passed away last year.  And since yesterday patient started to complain about severe sharp-like back pain on the left side, with dysuria, denies any abdominal pain and the flank pain has been localized nonradiating.  Denies any fever or chills.  Patient denied any vision changes no numbness tingling weakness of any of the limbs.   ED Course: Blood pressure significantly elevated with SBP> 220.  CT head showed no acute right internal capsule stroke.  Blood work showed hemoglobin 15.3, WBC 9.5, creatinine 0.9, K3.7, glucose 100.   Brain MRI showed acute infarct of right thalamus and right posterior limb of internal capsule.  CT abdominal pelvis renal stone study negative for acute findings.  Urine analysis showed leukocytes but negative fornitrites, WBCs 21-50   Patient was started on cefadroxil in the ED.  Assessment/Plan: # Acute metabolic encephalopathy Multifactorial, could be due to hypertensive emergency, UTI and CVA Encephalopathy resolved, patient is currently back to  her baseline. # Acute ischemic stroke, right thalamus and right posterior limb of internal capsule Likely secondary to untreated HTN.  No obvious corresponding neurological deficit Patient is probably out of window for permissive hypertension, started amlodipine  and  lisinopril . Review of CT head and neck with showing at  least 75% stenosis of right ICA origin.  Discussed with neurology about indication for vascular intervention, and Dr. Matthews recommend outpatient vascular surgery follow-up for elective intervention. Neurology recommended DAPT, aspirin  and Plavix  for 3 months followed by aspirin  only.  LDL 149, goal <70, started Lipitor  80 mg p.o. nightly.  Repeat lipid profile after 3 to 6 months and titrate dose accordingly.  Follow-up with neurology in 1 to 2 weeks.  TTE shows LVEF 50 to 55%, grade 1 diastolic dysfunction.  Left atrial size is mildly dilated.  Neurology recommended Zio patch to rule out occult arrhythmia.  Notified to cardiology to arrange for Zio patch as an outpatient. Patient is to follow with cardiology for cardiac monitor as an outpatient.   # HTN emergency: With signs of acute endorgan damage of acute stroke and possibly acute metabolic antibody. Started amlodipine  10 mg p.o. daily and lisinopril  10 mg p.o. daily.  BP improved, patient was advised to monitor BP at home and follow with PCP for further management. # UTI: s/p ceftriaxone  given during hospital stay.  Urine culture was not sent.  Patient was discharged on Augmentin, but pt has skin rash allergic reaction so it was dc'd and prescribed Omnicef  300 mg po BID for 3 days. Follow with PCP for further management as an outpatient. # Lower backache: Possible due to lumbar degenerative changes.  No fracture noticed on CT scan.  Renal stone ruled out, acute nephritis ruled out.  Continue Tylenol  as needed for pain control and follow with PCP.  Body mass index is 36.96 kg/m.  Nutrition Interventions:  - Patient was instructed, not to drive, operate heavy machinery, perform activities at heights, swimming or participation in water activities or provide baby sitting services while on Pain, Sleep and Anxiety Medications; until her outpatient Physician has advised to do so again.  - Also recommended to not to take more than prescribed Pain, Sleep  and Anxiety Medications.  Patient was seen by physical therapy, who recommended Home health, which was arranged. On the day of the discharge the patient's vitals were stable, and no other acute medical condition were reported by patient. the patient was felt safe to be discharge at Home with Home health.  Consultants: Neurology Procedures: None  Discharge Exam: General: Appear in no distress, no Rash; Oral Mucosa Clear, moist. Cardiovascular: S1 and S2 Present, no Murmur, Respiratory: normal respiratory effort, Bilateral Air entry present and no Crackles, no wheezes Abdomen: Bowel Sound present, Soft and no tenderness, no hernia Extremities: no Pedal edema, no calf tenderness Neurology: alert and oriented to time, place, and person affect appropriate.  There were no vitals filed for this visit. Vitals:   05/05/23 0915 05/05/23 1158  BP: 128/67 (!) 136/53  Pulse:  81  Resp:  18  Temp:  98.3 F (36.8 C)  SpO2:  96%    DISCHARGE MEDICATION: Allergies as of 05/05/2023       Reactions   Sulfa Antibiotics Itching   Sulfate    Penicillin G Rash        Medication List     TAKE these medications    acetaminophen  325 MG tablet Commonly known as: TYLENOL  Take 2 tablets (650 mg total) by  mouth 3 (three) times daily as needed for mild pain (pain score 1-3), moderate pain (pain score 4-6), fever or headache (or temp > 37.5 C (99.5 F)).   amLODipine  10 MG tablet Commonly known as: NORVASC  Take 1 tablet (10 mg total) by mouth daily. Skip the dose if systolic BP less than 140 mmHg   aspirin  EC 81 MG tablet Take 1 tablet (81 mg total) by mouth daily. Swallow whole.   atorvastatin  80 MG tablet Commonly known as: LIPITOR  Take 1 tablet (80 mg total) by mouth at bedtime.   clopidogrel  75 MG tablet Commonly known as: PLAVIX  Take 1 tablet (75 mg total) by mouth daily.   lisinopril  10 MG tablet Commonly known as: ZESTRIL  Take 1 tablet (10 mg total) by mouth daily. Skip the dose  if systolic BP less than 140 mmHg      Omnicef  300 mg po BID foe 3 days   Allergies  Allergen Reactions   Sulfa Antibiotics Itching   Sulfate    Penicillin G Rash   Discharge Instructions     Ambulatory referral to Neurology   Complete by: As directed    Call MD for:   Complete by: As directed    Weakness or numbness, any new neurological changes.  Any signs of bleeding or bruises   Call MD for:  difficulty breathing, headache or visual disturbances   Complete by: As directed    Call MD for:  extreme fatigue   Complete by: As directed    Call MD for:  persistant dizziness or light-headedness   Complete by: As directed    Call MD for:  persistant nausea and vomiting   Complete by: As directed    Call MD for:  severe uncontrolled pain   Complete by: As directed    Call MD for:  temperature >100.4   Complete by: As directed    Diet - low sodium heart healthy   Complete by: As directed    Discharge instructions   Complete by: As directed    Follow with PCP in 1 week, continue to monitor BP at home and follow with PCP to titrate medication accordingly. Follow-up with cardiology for Zio patch for 2 weeks to rule out occult arrhythmias. Follow-up with neurology in 1 to 2 weeks Follow-up with vascular surgery for right carotid stenosis.   Increase activity slowly   Complete by: As directed        The results of significant diagnostics from this hospitalization (including imaging, microbiology, ancillary and laboratory) are listed below for reference.    Significant Diagnostic Studies: ECHOCARDIOGRAM COMPLETE Result Date: 05/04/2023    ECHOCARDIOGRAM REPORT   Patient Name:   Alexa Ingram Date of Exam: 05/04/2023 Medical Rec #:  [id number]   Height: Accession #:    [id number]  Weight:       183.0 lb Date of Birth:  1940-12-23    BSA:          1.903 m Patient Age:    82 years    BP:           238/86 mmHg Patient Gender: F           HR:           76 bpm. Exam Location:  [facility]  Procedure: 2D Echo, Cardiac Doppler and Color Doppler Indications:     Stroke  History:         Patient has no prior history of Echocardiogram examinations.  Stroke.  Sonographer:     Naomie Reef Referring Phys:  [id number] CORT ONEIDA MANA Diagnosing Phys: Lonni Hanson MD IMPRESSIONS  1. Left ventricular ejection fraction, by estimation, is 50 to 55%. The left ventricle has low normal function. The left ventricle has no regional wall motion abnormalities. There is mild left ventricular hypertrophy. Left ventricular diastolic parameters are consistent with Grade I diastolic dysfunction (impaired relaxation). Elevated left atrial pressure.  2. Right ventricular systolic function is normal. The right ventricular size is normal. Mildly increased right ventricular wall thickness. There is normal pulmonary artery systolic pressure.  3. Left atrial size was mildly dilated.  4. The mitral valve is normal in structure. Mild mitral valve regurgitation. No evidence of mitral stenosis.  5. The aortic valve is tricuspid. There is mild calcification of the aortic valve. There is mild thickening of the aortic valve. Aortic valve regurgitation is mild to moderate. Aortic valve sclerosis/calcification is present, without any evidence of aortic stenosis.  6. The inferior vena cava is normal in size with greater than 50% respiratory variability, suggesting right atrial pressure of 3 mmHg. FINDINGS  Left Ventricle: Left ventricular ejection fraction, by estimation, is 50 to 55%. The left ventricle has low normal function. The left ventricle has no regional wall motion abnormalities. The left ventricular internal cavity size was normal in size. There is mild left ventricular hypertrophy. Left ventricular diastolic parameters are consistent with Grade I diastolic dysfunction (impaired relaxation). Elevated left atrial pressure. Right Ventricle: The right ventricular size is normal. Mildly increased right ventricular  wall thickness. Right ventricular systolic function is normal. There is normal pulmonary artery systolic pressure. The tricuspid regurgitant velocity is 2.30 m/s, and with an assumed right atrial pressure of 3 mmHg, the estimated right ventricular systolic pressure is 24.2 mmHg. Left Atrium: Left atrial size was mildly dilated. Right Atrium: Right atrial size was normal in size. Pericardium: There is no evidence of pericardial effusion. Mitral Valve: The mitral valve is normal in structure. Mild mitral valve regurgitation. No evidence of mitral valve stenosis. MV peak gradient, 6.9 mmHg. The mean mitral valve gradient is 3.0 mmHg. Tricuspid Valve: The tricuspid valve is not well visualized. Tricuspid valve regurgitation is trivial. Aortic Valve: The aortic valve is tricuspid. There is mild calcification of the aortic valve. There is mild thickening of the aortic valve. Aortic valve regurgitation is mild to moderate. Aortic regurgitation PHT measures 376 msec. Aortic valve sclerosis/calcification is present, without any evidence of aortic stenosis. Aortic valve mean gradient measures 8.0 mmHg. Aortic valve peak gradient measures 16.5 mmHg. Aortic valve area, by VTI measures 1.96 cm. Pulmonic Valve: The pulmonic valve was not well visualized. Pulmonic valve regurgitation is not visualized. No evidence of pulmonic stenosis. Aorta: The aortic root and ascending aorta are structurally normal, with no evidence of dilitation. Pulmonary Artery: The pulmonary artery is of normal size. Venous: The inferior vena cava is normal in size with greater than 50% respiratory variability, suggesting right atrial pressure of 3 mmHg. IAS/Shunts: No atrial level shunt detected by color flow Doppler.  LEFT VENTRICLE PLAX 2D LVIDd:         4.30 cm     Diastology LVIDs:         3.40 cm     LV e' medial:    5.61 cm/s LV PW:         1.00 cm     LV E/e' medial:  15.8 LV IVS:        1.20 cm  LV e' lateral:   7.97 cm/s LVOT diam:     1.90 cm      LV E/e' lateral: 11.1 LV SV:         83 LV SV Index:   44 LVOT Area:     2.84 cm  LV Volumes (MOD) LV vol d, MOD A2C: 66.8 ml LV vol d, MOD A4C: 68.2 ml LV vol s, MOD A2C: 27.8 ml LV vol s, MOD A4C: 32.9 ml LV SV MOD A2C:     39.0 ml LV SV MOD A4C:     68.2 ml LV SV MOD BP:      38.9 ml RIGHT VENTRICLE RV Basal diam:  3.15 cm RV Mid diam:    2.60 cm RV S prime:     15.00 cm/s TAPSE (M-mode): 2.0 cm LEFT ATRIUM             Index        RIGHT ATRIUM           Index LA diam:        4.30 cm 2.26 cm/m   RA Area:     13.70 cm LA Vol (A2C):   68.9 ml 36.20 ml/m  RA Volume:   33.90 ml  17.81 ml/m LA Vol (A4C):   63.0 ml 33.10 ml/m LA Biplane Vol: 66.1 ml 34.73 ml/m  AORTIC VALVE                     PULMONIC VALVE AV Area (Vmax):    1.96 cm      PV Vmax:       1.08 m/s AV Area (Vmean):   1.95 cm      PV Peak grad:  4.7 mmHg AV Area (VTI):     1.96 cm AV Vmax:           203.00 cm/s AV Vmean:          128.000 cm/s AV VTI:            0.423 m AV Peak Grad:      16.5 mmHg AV Mean Grad:      8.0 mmHg LVOT Vmax:         140.00 cm/s LVOT Vmean:        88.000 cm/s LVOT VTI:          0.293 m LVOT/AV VTI ratio: 0.69 AI PHT:            376 msec  AORTA Ao Root diam: 3.20 cm Ao Asc diam:  3.20 cm MITRAL VALVE                TRICUSPID VALVE MV Area (PHT): 2.94 cm     TR Peak grad:   21.2 mmHg MV Area VTI:   2.16 cm     TR Vmax:        230.00 cm/s MV Peak grad:  6.9 mmHg MV Mean grad:  3.0 mmHg     SHUNTS MV Vmax:       1.31 m/s     Systemic VTI:  0.29 m MV Vmean:      82.5 cm/s    Systemic Diam: 1.90 cm MV Decel Time: 258 msec MV E velocity: 88.60 cm/s MV A velocity: 108.00 cm/s MV E/A ratio:  0.82 Lonni End MD Electronically signed by Lonni Hanson MD Signature Date/Time: 05/04/2023/7:10:35 PM    Final    CT Angio Head Neck W WO CM Result Date: 05/04/2023 CLINICAL DATA:  Stroke/TIA, determine embolic source. EXAM: CT ANGIOGRAPHY HEAD AND NECK WITH AND WITHOUT CONTRAST TECHNIQUE: Multidetector CT imaging of the  head and neck was performed using the standard protocol during bolus administration of intravenous contrast. Multiplanar CT image reconstructions and MIPs were obtained to evaluate the vascular anatomy. Carotid stenosis measurements (when applicable) are obtained utilizing NASCET criteria, using the distal internal carotid diameter as the denominator. RADIATION DOSE REDUCTION: This exam was performed according to the departmental dose-optimization program which includes automated exposure control, adjustment of the mA and/or kV according to patient size and/or use of iterative reconstruction technique. CONTRAST:  75mL OMNIPAQUE  IOHEXOL  350 MG/ML SOLN COMPARISON:  Head CT 05/03/2023. FINDINGS: CT HEAD FINDINGS Brain: No acute hemorrhage. Acute infarct in the anterolateral aspect of the right thalamus and posterior limb of the right internal capsule. No significant mass effect. Background of mild chronic small-vessel disease. No new loss of gray-white differentiation. No hydrocephalus or extra-axial collection. No midline shift. Vascular: No hyperdense vessel or unexpected calcification. Skull: No calvarial fracture or suspicious bone lesion. Skull base is unremarkable. Sinuses/Orbits: No acute finding. Other: None. Review of the MIP images confirms the above findings CTA NECK FINDINGS Aortic arch: Three-vessel arch configuration. Atherosclerotic calcifications of the aortic arch and arch vessel origins. Predominantly calcified plaque results in moderate stenosis of the left subclavian artery origin. Right carotid system: Limited evaluation of the carotid bifurcation due to motion artifact. Within this limitation, mixed plaque results in at least 75% stenosis of the right ICA origin. Left carotid system: Limited evaluation of the carotid bifurcation due to motion artifact. Within this limitation, mixed plaque results in approximately 50% stenosis of the distal left CCA. Vertebral arteries: Right dominant. Limited  evaluation of the proximal and mid V2 segments due to motion artifact. Within this limitation, no evidence of occlusion, stenosis, or dissection. Skeleton: Mild cervical spondylosis without high-grade spinal canal stenosis. Other neck: Unremarkable. Upper chest: Unremarkable. Review of the MIP images confirms the above findings CTA HEAD FINDINGS Anterior circulation: Calcified plaque along the carotid siphons without hemodynamically significant stenosis. Moderate stenosis of the distal left A2 segment. Moderate to severe stenosis of the distal right M1 segment. Distal branches are symmetric. Posterior circulation: Diminutive basilar artery. The SCAs and PICAs are patent proximally. The AICAs are not well visualized. Multifocal moderate to severe stenosis of the left PCA at the P1-P2 junction, and within the proximal and mid P2 segment. Severe stenosis of the inferior P3 division of the right PCA. Distal branches are symmetric. Venous sinuses: As permitted by contrast timing, patent. Anatomic variants: Persistent fetal origin of the bilateral PCAs with hypoplastic P1 segments. Review of the MIP images confirms the above findings IMPRESSION: 1. Acute infarct in the anterolateral aspect of the right thalamus and posterior limb of the right internal capsule. No acute hemorrhage. 2. Limited evaluation of the carotid bifurcations due to motion artifact. Within this limitation, mixed plaque results in at least 75% stenosis of the right ICA origin and 50% stenosis of the distal left CCA. 3. Moderate stenosis of the left subclavian artery origin. 4. Moderate stenosis of the distal left A2 segment. 5. Moderate to severe stenosis of the distal right M1 segment. 6. Multifocal moderate to severe stenosis of the left PCA at the P1-P2 junction, and within the proximal and mid P2 segment. 7. Severe stenosis of the inferior P3 division of the right PCA. Aortic Atherosclerosis (ICD10-I70.0). Electronically Signed   By: Ryan Chess M.D.   On: 05/04/2023 09:33  CT Renal Stone Study Result Date: 05/04/2023 CLINICAL DATA:  One-week history of altered mental status. Right-sided lumbar back pain EXAM: CT ABDOMEN AND PELVIS WITHOUT CONTRAST TECHNIQUE: Multidetector CT imaging of the abdomen and pelvis was performed following the standard protocol without IV contrast. RADIATION DOSE REDUCTION: This exam was performed according to the departmental dose-optimization program which includes automated exposure control, adjustment of the mA and/or kV according to patient size and/or use of iterative reconstruction technique. COMPARISON:  CT abdomen and pelvis dated 10/10/2009 FINDINGS: Lower chest: No focal consolidation or pulmonary nodule in the lung bases. No pleural effusion or pneumothorax demonstrated. Multichamber cardiomegaly. Coronary artery calcifications. Hepatobiliary: No focal hepatic lesions. No intra or extrahepatic biliary ductal dilation. Cholelithiasis. Pancreas: No focal lesions or main ductal dilation. Spleen: Normal in size without focal abnormality. Adrenals/Urinary Tract: No adrenal nodules. No suspicious renal mass on this noncontrast enhanced examination , calculi or hydronephrosis. No focal bladder wall thickening. Stomach/Bowel: Normal appearance of the stomach. No evidence of bowel wall thickening, distention, or inflammatory changes. Colonic diverticulosis without acute diverticulitis. Normal appendix. Vascular/Lymphatic: Aortic atherosclerosis. No enlarged abdominal or pelvic lymph nodes. Reproductive: No adnexal masses. Other: No free fluid, fluid collection, or free air. Musculoskeletal: No acute or abnormal lytic or blastic osseous lesions. Multilevel degenerative changes of the partially imaged thoracic and lumbar spine. Small fat-containing paraumbilical hernia. IMPRESSION: 1. No acute abdominopelvic findings. 2. Cholelithiasis without evidence of acute cholecystitis. 3. Colonic diverticulosis without acute  diverticulitis. 4. Multichamber cardiomegaly. 5. Aortic Atherosclerosis (ICD10-I70.0). Coronary artery calcifications. Assessment for potential risk factor modification, dietary therapy or pharmacologic therapy may be warranted, if clinically indicated. Electronically Signed   By: Limin  Xu M.D.   On: 05/04/2023 09:28   MR BRAIN WO CONTRAST Result Date: 05/04/2023 CLINICAL DATA:  Neuro deficit, acute, stroke suspected Mental status change, unknown cause. EXAM: MRI HEAD WITHOUT CONTRAST TECHNIQUE: Multiplanar, multiecho pulse sequences of the brain and surrounding structures were obtained without intravenous contrast. COMPARISON:  Head CT 05/03/2023. FINDINGS: Brain: Acute infarct in the anterolateral aspect of the right thalamus and posterior limb of the right internal capsule. No acute hemorrhage or significant mass effect. Background of mild chronic small-vessel disease. Hydrocephalus or extra-axial collection. No midline shift. No foci of abnormal susceptibility. Vascular: Normal flow voids. Skull and upper cervical spine: Normal marrow signal. Sinuses/Orbits: No acute findings. Other: None. IMPRESSION: Acute infarct in the anterolateral aspect of the right thalamus and posterior limb of the right internal capsule. No acute hemorrhage or significant mass effect. Electronically Signed   By: Ryan Chess M.D.   On: 05/04/2023 09:16   CT Head Wo Contrast Result Date: 05/03/2023 CLINICAL DATA:  Confusion. EXAM: CT HEAD WITHOUT CONTRAST TECHNIQUE: Contiguous axial images were obtained from the base of the skull through the vertex without intravenous contrast. RADIATION DOSE REDUCTION: This exam was performed according to the departmental dose-optimization program which includes automated exposure control, adjustment of the mA and/or kV according to patient size and/or use of iterative reconstruction technique. COMPARISON:  CTA head and neck 05/21/2018 FINDINGS: Brain: A 1.5 cm focus of low-density involving  the posterior limb and genu of the right internal capsule is new and suspicious for an acute to subacute infarct. There is an unchanged separate chronic lacunar infarct at the posterior aspect of the right putamen. Hypodensities elsewhere in the cerebral white matter bilaterally are nonspecific but compatible with mild chronic small vessel ischemic disease. No acute cortically based infarct, intracranial hemorrhage, mass, midline shift, or extra-axial fluid  collection is identified. The ventricles are normal in size. Vascular: Calcified atherosclerosis at the skull base. No hyperdense vessel. Skull: No acute fracture or suspicious osseous lesion. Sinuses/Orbits: Visualized paranasal sinuses and mastoid air cells are clear. Unremarkable orbits. Other: None. IMPRESSION: 1. Suspected recent infarct in the right internal capsule. 2. Mild chronic small vessel ischemic disease. Electronically Signed   By: Dasie Hamburg M.D.   On: 05/03/2023 16:53    Microbiology: No results found for this or any previous visit (from the past 240 hours).   Labs: CBC: Recent Labs  Lab 05/03/23 1622  WBC 9.5  NEUTROABS 6.2  HGB 15.3*  HCT 45.4  MCV 90.6  PLT 254   Basic Metabolic Panel: Recent Labs  Lab 05/03/23 1622  NA 140  K 3.7  CL 102  CO2 26  GLUCOSE 102*  BUN 16  CREATININE 0.99  CALCIUM  9.3   Liver Function Tests: Recent Labs  Lab 05/03/23 1622  AST 20  ALT 16  ALKPHOS 79  BILITOT 1.0  PROT 7.2  ALBUMIN 4.1   No results for input(s): LIPASE, AMYLASE in the last 168 hours. No results for input(s): AMMONIA in the last 168 hours. Cardiac Enzymes: No results for input(s): CKTOTAL, CKMB, CKMBINDEX, TROPONINI in the last 168 hours. BNP (last 3 results) No results for input(s): BNP in the last 8760 hours. CBG: No results for input(s): GLUCAP in the last 168 hours.  Time spent: 35 minutes  Signed:  Elvan Sor  [facility] 1/1/20251/06/2023 8:04 PM

## 2023-05-06 ENCOUNTER — Encounter: Payer: Self-pay | Admitting: Emergency Medicine

## 2023-05-06 ENCOUNTER — Encounter: Payer: Self-pay | Admitting: Student

## 2023-05-06 ENCOUNTER — Ambulatory Visit: Payer: Self-pay

## 2023-05-06 ENCOUNTER — Other Ambulatory Visit: Payer: Self-pay | Admitting: Student

## 2023-05-06 MED ORDER — CEFDINIR 300 MG PO CAPS: 300.0000 mg | ORAL_CAPSULE | Freq: Two times a day (BID) | ORAL | 0 refills | Status: AC

## 2023-05-06 MED ORDER — QUETIAPINE FUMARATE 25 MG PO TABS: 12.5000 mg | ORAL_TABLET | Freq: Two times a day (BID) | ORAL | 0 refills | Status: DC | PRN

## 2023-05-06 NOTE — Telephone Encounter (Signed)
  Inbound call received. New pt appt needed. Alexa Ingram called to make a hospital follow-up appt, but mentioned the pt has visual hallucinations so was transferred to [name]. [name] stated the hallucinations aren't new and the providers are aware of them.  Pt verbalized to her son that she had dinner with her ex-husband of thirty years last night and saw her deceased daughter. Pt has history of mini-strokes. New pt appt made for 05/10/23 at [name] with [name].       Copied from CRM [id number]. Topic: Clinical - Red Word Triage >> May 06, 2023  4:00 PM [name] wrote: [facility] that prompted transfer to Nurse Triage: Patient granddaughter called in. Patient is having visual hallucinations but knows what is going on. Patient recently had a UTI and was discharged from the hospital. She has had multiple mini strokes in the past year.

## 2023-05-06 NOTE — Progress Notes (Signed)
 Discontinued Augmentin and started Omnicef 300 mg po BID for possible UTI. C/o visual hallucinations, so started Seroquel 12.5 mg po BID prn. Recommended to f/u with PCP and Neurology in 1 wk

## 2023-05-10 ENCOUNTER — Ambulatory Visit (INDEPENDENT_AMBULATORY_CARE_PROVIDER_SITE_OTHER): Payer: 59 | Admitting: Urgent Care

## 2023-05-10 ENCOUNTER — Encounter: Payer: Self-pay | Admitting: Urgent Care

## 2023-05-10 VITALS — BP 176/70 | HR 76 | Temp 97.9°F | Ht <= 58 in | Wt 117.1 lb

## 2023-05-10 DIAGNOSIS — G9340 Encephalopathy, unspecified: Secondary | ICD-10-CM

## 2023-05-10 DIAGNOSIS — K8020 Calculus of gallbladder without cholecystitis without obstruction: Secondary | ICD-10-CM

## 2023-05-10 DIAGNOSIS — Z8673 Personal history of transient ischemic attack (TIA), and cerebral infarction without residual deficits: Secondary | ICD-10-CM | POA: Diagnosis not present

## 2023-05-10 DIAGNOSIS — R7303 Prediabetes: Secondary | ICD-10-CM

## 2023-05-10 DIAGNOSIS — M1990 Unspecified osteoarthritis, unspecified site: Secondary | ICD-10-CM

## 2023-05-10 DIAGNOSIS — R944 Abnormal results of kidney function studies: Secondary | ICD-10-CM | POA: Diagnosis not present

## 2023-05-10 DIAGNOSIS — N3000 Acute cystitis without hematuria: Secondary | ICD-10-CM

## 2023-05-10 DIAGNOSIS — I10 Essential (primary) hypertension: Secondary | ICD-10-CM

## 2023-05-10 DIAGNOSIS — D582 Other hemoglobinopathies: Secondary | ICD-10-CM

## 2023-05-10 DIAGNOSIS — I517 Cardiomegaly: Secondary | ICD-10-CM

## 2023-05-10 DIAGNOSIS — I700 Atherosclerosis of aorta: Secondary | ICD-10-CM

## 2023-05-10 DIAGNOSIS — I6521 Occlusion and stenosis of right carotid artery: Secondary | ICD-10-CM

## 2023-05-10 DIAGNOSIS — R29898 Other symptoms and signs involving the musculoskeletal system: Secondary | ICD-10-CM

## 2023-05-10 DIAGNOSIS — I639 Cerebral infarction, unspecified: Secondary | ICD-10-CM

## 2023-05-10 DIAGNOSIS — E782 Mixed hyperlipidemia: Secondary | ICD-10-CM

## 2023-05-10 LAB — POCT URINALYSIS DIPSTICK
Bilirubin, UA: NEGATIVE
Blood, UA: NEGATIVE
Glucose, UA: NEGATIVE
Ketones, UA: NEGATIVE
Leukocytes, UA: NEGATIVE
Nitrite, UA: NEGATIVE
Protein, UA: NEGATIVE
Spec Grav, UA: 1.01 (ref 1.010–1.025)
Urobilinogen, UA: 0.2 U/dL
pH, UA: 6 (ref 5.0–8.0)

## 2023-05-10 MED ORDER — ATORVASTATIN CALCIUM 80 MG PO TABS: 80.0000 mg | ORAL_TABLET | Freq: Every day | ORAL | 1 refills | Status: DC

## 2023-05-10 MED ORDER — CLOPIDOGREL BISULFATE 75 MG PO TABS: 75.0000 mg | ORAL_TABLET | Freq: Every day | ORAL | 1 refills | Status: AC

## 2023-05-10 MED ORDER — LISINOPRIL 10 MG PO TABS: 10.0000 mg | ORAL_TABLET | Freq: Every day | ORAL | 1 refills | Status: DC

## 2023-05-10 MED ORDER — ASPIRIN 81 MG PO TBEC: 81.0000 mg | DELAYED_RELEASE_TABLET | Freq: Every day | ORAL | 1 refills | Status: AC

## 2023-05-10 MED ORDER — AMLODIPINE BESYLATE 10 MG PO TABS: 10.0000 mg | ORAL_TABLET | Freq: Every day | ORAL | 1 refills | Status: DC

## 2023-05-10 NOTE — Assessment & Plan Note (Signed)
 Pt asx - this was also noted on CT abdomen 13 years ago. Stable, no further workup indicated

## 2023-05-10 NOTE — Patient Instructions (Addendum)
 Please call both cardiology and vascular per your hospital discharge instructions asap to make a follow up appointment.  Please call [facility] Address: [address] [place], [place] [address] Phone: [phone]  Call to schedule your renal artery ultrasound.  We will contact you with the urine culture if repeat antibiotics are indicated, however the urine dipstick is negative.   Please continue to monitor your blood pressure at home with a goal closer to 150/80.  DRINK WATER!! You must cut back on Dr. Nunzio and drink water! 3-4 16oz bottles daily preferred!  I have placed a referral to home health for SLP, OT and PT.   For your back pain, Please take 500-650mg  tylenol  (acetaminophen ) every 6-8 hours as needed.  You can take OTC Turmeric 500mg  daily. This helps with joint inflammation and pain. OTC Fish Oil 1000mcg with EPA/DHA can be taken.

## 2023-05-10 NOTE — Assessment & Plan Note (Signed)
 History of vascular disease with 75% stenosis in the right carotid artery, 50% stenosis of left carotid artery (2020 study showed 65% R, 25% L) -Continue Plavix  and baby aspirin  for antiplatelet therapy. -Schedule follow up with vascular specialist for evaluation of possible carotid endarterectomy vs other intervention

## 2023-05-10 NOTE — Assessment & Plan Note (Addendum)
 History of hypertension, recently started on Amlodipine  10mg  and Lisinopril  10mg  6 days prior. Home BP readings have been in the 150/70 range. Given BP rechecks in office manually, I have significant concern for pseudohypertension given positive Oslers maneuver (due to partially non-compressible vessels.) -Continue current medications; continue home monitoring -Pt already Referred to cardiology for further evaluation; zio patch pending. Call to schedule

## 2023-05-10 NOTE — Assessment & Plan Note (Signed)
 Stable low GFR for several years. -Given significant atherosclerosis and persistent renal impairment, will order renal artery ultrasound to evaluate for renal artery stenosis.

## 2023-05-10 NOTE — Assessment & Plan Note (Signed)
 Recent UTI that caused delirium- was successfully treated with antibiotics in the hospital. Urine now appears normal. -pt denies urinary sx, denies residual confusion -Perform urine culture. Pt did not start the cefdinir

## 2023-05-10 NOTE — Assessment & Plan Note (Addendum)
 Recent Stroke Recent hospitalization for stroke. No current falls reported. Pt c/o B lower extremity weakness. No speech abnormalities or dysphagia -Will refer to home health assessment - PT, OT and SLP -Physical therapy assessment and treatment as needed. -Continue use of walker, remove all trip hazards at home (ie: throw rugs) -wear supportive shoes or grip socks

## 2023-05-10 NOTE — Assessment & Plan Note (Signed)
 resolved

## 2023-05-10 NOTE — Assessment & Plan Note (Signed)
 Pt with c/o lower back pain, hands show generalized OA -Tylenol (acetaminophen) 500-650mg  Q 6-8 hrs as needed -Turmeric 500mg  OTC -Fish Oil daily with EPA/ DHA

## 2023-05-10 NOTE — Progress Notes (Signed)
 New Patient Office Visit  Subjective:  Patient ID: Alexa Ingram, female    DOB: Sep 14, 1940  Age: 83 y.o. MRN: [id number]  CC:  Chief Complaint  Patient presents with   Hospitalization Follow-up    New hospital follow up. Pt will est care closer to [place]. She had a recent UTI and stroke.    Discussed the use of AI software for clinical note transcription with the patient who Ingram verbal consent to proceed.   HPI Alexa Ingram presents to establish care. She is accompanied by her granddaughter and grandson who help provide the history when needed.  Pleasant 83yo female, with a history of high blood pressure and recent stroke, presents for a follow-up consultation. Prior to the stroke, the patient was living independently and had been previously informed of her high blood pressure. The patient reported feeling just okay since the stroke, with no significant improvement or deterioration. While in the hospital, she was found to have acute encephalitis/ delirium, which per grandson, has since resolved. States she feels tired and legs weak, but otherwise denies any acute complaints. Pt's grandson is now living with her and helping with her care.  The patient had been prescribed Augmentin during a recent hospital stay, which was discontinued due to a rash, a known allergic reaction to penicillin. The rash has since resolved. The patient was then prescribed Omnicef , but it was never picked up or taken.  The patient's urine sample was tested and found to be normal, suggesting the resolution of a previous urinary tract infection (UTI).  The patient reported weakness in both legs since the stroke, with no specific side being worse than the other. The patient denied any falls and was discharged home with a walker for mobility. The patient also reported back pain, particularly when standing or sitting for long periods, which was localized to the spine and not associated with any kidney  pain.  The patient has a history of smoking for two to three years, but not heavily, and has a family history of high cholesterol and vascular issues. The patient's blood sugar was tested and found to be prediabetic (A1C 5.8%). The patient's kidney function, as indicated by a GFR test, has been low but stable for several years- mid-50s.  The patient has a history of vascular disease, with stenosis noted in the vessels. A renal artery stenosis evaluation has not been performed, despite persistent low kidney function and unresponsive blood pressure. The patient also has a confirmed 75% stenosis in the right artery, which is being managed with cholesterol medication, Plavix , and baby aspirin  to prevent further plaque build-up.  The patient reported some chronic hearing loss in R ear. The patient also reported feeling constantly tired. She states the main reason she stopped taking her BP medication several years ago was lethargy, feeling sluggish and other abnormal side effects.  CTA HEAD / NECK 05/04/2023:  IMPRESSION: 1. Acute infarct in the anterolateral aspect of the right thalamus and posterior limb of the right internal capsule. No acute hemorrhage. 2. Limited evaluation of the carotid bifurcations due to motion artifact. Within this limitation, mixed plaque results in at least 75% stenosis of the right ICA origin and 50% stenosis of the distal left CCA. 3. Moderate stenosis of the left subclavian artery origin. 4. Moderate stenosis of the distal left A2 segment. 5. Moderate to severe stenosis of the distal right M1 segment. 6. Multifocal moderate to severe stenosis of the left PCA at the P1-P2 junction, and  within the proximal and mid P2 segment. 7. Severe stenosis of the inferior P3 division of the right PCA.   Aortic Atherosclerosis (ICD10-I70.0).   CT RENAL STONE STUDY 05/04/23  IMPRESSION: 1. No acute abdominopelvic findings. 2. Cholelithiasis without evidence of acute  cholecystitis. 3. Colonic diverticulosis without acute diverticulitis. 4. Multichamber cardiomegaly. 5. Aortic Atherosclerosis (ICD10-I70.0). Coronary artery calcifications. Assessment for potential risk factor modification, dietary therapy or pharmacologic therapy may be warranted, if clinically indicated.   ECHOCARDIOGRAM 05/04/23  IMPRESSIONS   1. Left ventricular ejection fraction, by estimation, is 50 to 55%. The  left ventricle has low normal function. The left ventricle has no regional  wall motion abnormalities. There is mild left ventricular hypertrophy.  Left ventricular diastolic  parameters are consistent with Grade I diastolic dysfunction (impaired  relaxation). Elevated left atrial pressure.   2. Right ventricular systolic function is normal. The right ventricular  size is normal. Mildly increased right ventricular wall thickness. There  is normal pulmonary artery systolic pressure.   3. Left atrial size was mildly dilated.   4. The mitral valve is normal in structure. Mild mitral valve  regurgitation. No evidence of mitral stenosis.   5. The aortic valve is tricuspid. There is mild calcification of the  aortic valve. There is mild thickening of the aortic valve. Aortic valve  regurgitation is mild to moderate. Aortic valve sclerosis/calcification is  present, without any evidence of  aortic stenosis.   6. The inferior vena cava is normal in size with greater than 50%  respiratory variability, suggesting right atrial pressure of 3 mmHg.     Outpatient Encounter Medications as of 05/10/2023  Medication Sig   acetaminophen  (TYLENOL ) 325 MG tablet Take 2 tablets (650 mg total) by mouth 3 (three) times daily as needed for mild pain (pain score 1-3), moderate pain (pain score 4-6), fever or headache (or temp > 37.5 C (99.5 F)).   vitamin C (ASCORBIC ACID) 500 MG tablet Take 500 mg by mouth daily.   [DISCONTINUED] amLODipine  (NORVASC ) 10 MG tablet Take 1 tablet (10 mg  total) by mouth daily. Skip the dose if systolic BP less than 140 mmHg   [DISCONTINUED] aspirin  EC 81 MG tablet Take 1 tablet (81 mg total) by mouth daily. Swallow whole.   [DISCONTINUED] atorvastatin  (LIPITOR ) 80 MG tablet Take 1 tablet (80 mg total) by mouth at bedtime.   [DISCONTINUED] clopidogrel  (PLAVIX ) 75 MG tablet Take 1 tablet (75 mg total) by mouth daily.   [DISCONTINUED] hydrALAZINE  (APRESOLINE ) 50 MG tablet Take 1 tablet (50 mg total) by mouth 2 (two) times daily.   [DISCONTINUED] lisinopril  (ZESTRIL ) 10 MG tablet Take 1 tablet (10 mg total) by mouth daily. Skip the dose if systolic BP less than 140 mmHg   [DISCONTINUED] QUEtiapine  (SEROQUEL ) 25 MG tablet Take 0.5 tablets (12.5 mg total) by mouth 2 (two) times daily as needed for up to 5 days (for delirium and hallucinations).   amLODipine  (NORVASC ) 10 MG tablet Take 1 tablet (10 mg total) by mouth daily. Skip the dose if systolic BP less than 140 mmHg   aspirin  EC 81 MG tablet Take 1 tablet (81 mg total) by mouth daily. Swallow whole.   atorvastatin  (LIPITOR ) 80 MG tablet Take 1 tablet (80 mg total) by mouth at bedtime.   clopidogrel  (PLAVIX ) 75 MG tablet Take 1 tablet (75 mg total) by mouth daily.   lisinopril  (ZESTRIL ) 10 MG tablet Take 1 tablet (10 mg total) by mouth daily. Skip the dose if systolic  BP less than 140 mmHg   [DISCONTINUED] amLODipine  (NORVASC ) 5 MG tablet Take 1 tablet (5 mg total) by mouth daily. (Patient not taking: Reported on 05/10/2023)   [DISCONTINUED] amLODipine -olmesartan (AZOR) 5-20 MG tablet Take 1 tablet by mouth daily. (Patient not taking: Reported on 05/10/2023)   [DISCONTINUED] potassium chloride  SA (K-DUR,KLOR-CON ) 20 MEQ tablet Take 1 tablet (20 mEq total) by mouth daily.   No facility-administered encounter medications on file as of 05/10/2023.    Past Medical History:  Diagnosis Date   Chicken pox    GERD (gastroesophageal reflux disease)    Hypertension    Osteopenia    Pure hypercholesterolemia     Stroke (HCC)    Urinary tract infection     Past Surgical History:  Procedure Laterality Date   ABDOMINAL HYSTERECTOMY  1976   ovaries remain   BREAST BIOPSY Right 1986   neg    Family History  Problem Relation Age of Onset   Osteoarthritis Mother    Stroke Mother    Hypertension Mother    Breast cancer Sister 34   Stroke Sister    Healthy Daughter    Healthy Son    Breast cancer Paternal Aunt 58    Social History   Socioeconomic History   Marital status: Single    Spouse name: Not on file   Number of children: Not on file   Years of education: Not on file   Highest education level: High school graduate  Occupational History   Not on file  Tobacco Use   Smoking status: Never   Smokeless tobacco: Never  Vaping Use   Vaping status: Never Used  Substance and Sexual Activity   Alcohol use: Not Currently   Drug use: Never   Sexual activity: Yes  Other Topics Concern   Not on file  Social History Narrative   ** Merged History Encounter **       Social Drivers of Health   Financial Resource Strain: Not on file  Food Insecurity: No Food Insecurity (05/04/2023)   Hunger Vital Sign    Worried About Running Out of Food in the Last Year: Never true    Ran Out of Food in the Last Year: Never true  Transportation Needs: No Transportation Needs (05/04/2023)   PRAPARE - Administrator, Civil Service (Medical): No    Lack of Transportation (Non-Medical): No  Physical Activity: Not on file  Stress: Not on file  Social Connections: Socially Isolated (05/04/2023)   Social Connection and Isolation Panel [NHANES]    Frequency of Communication with Friends and Family: Three times a week    Frequency of Social Gatherings with Friends and Family: Three times a week    Attends Religious Services: Never    Active Member of Clubs or Organizations: No    Attends Banker Meetings: Never    Marital Status: Divorced  Catering Manager Violence: Not At  Risk (05/04/2023)   Humiliation, Afraid, Rape, and Kick questionnaire    Fear of Current or Ex-Partner: No    Emotionally Abused: No    Physically Abused: No    Sexually Abused: No    ROS: as noted in HPI  Objective:  BP (!) 176/70   Pulse 76   Temp 97.9 F (36.6 C) (Oral)   Ht 4' 10 (1.473 m)   Wt 117 lb 1.9 oz (53.1 kg)   SpO2 96%   BMI 24.48 kg/m   Physical Exam Vitals and nursing note reviewed.  Exam conducted with a chaperone present (grandchildren).  Constitutional:      General: She is not in acute distress.    Appearance: Normal appearance. She is normal weight. She is not ill-appearing, toxic-appearing or diaphoretic.  HENT:     Head: Normocephalic and atraumatic.     Right Ear: Ear canal and external ear normal. A middle ear effusion is present. There is no impacted cerumen.     Left Ear: Ear canal and external ear normal. A middle ear effusion is present. There is no impacted cerumen.     Nose: Nose normal.     Mouth/Throat:     Mouth: Mucous membranes are moist.     Pharynx: Oropharynx is clear. No oropharyngeal exudate or posterior oropharyngeal erythema.  Eyes:     General: No scleral icterus.       Right eye: No discharge.        Left eye: No discharge.     Extraocular Movements: Extraocular movements intact.     Pupils: Pupils are equal, round, and reactive to light.  Cardiovascular:     Rate and Rhythm: Normal rate and regular rhythm.     Heart sounds: Murmur heard.     Comments: Palpable radial pulse during examination of BP raising the question of partial pseudohypertension Pulmonary:     Effort: Pulmonary effort is normal. No respiratory distress.     Breath sounds: Normal breath sounds. No stridor. No wheezing, rhonchi or rales.  Chest:     Chest wall: No tenderness.  Abdominal:     Palpations: Abdomen is soft.  Musculoskeletal:        General: Tenderness (to midline L spine, B SI joints, and paralumbar muscles bilaterally) present. No swelling  or deformity.     Cervical back: Normal range of motion and neck supple. No rigidity or tenderness.     Right lower leg: No edema.     Left lower leg: No edema.     Comments: L arm extension and flexion strength 4 to 4+ R arm strength 5  L thigh flexion 4 to 4+/5 R thigh flexion 5  All other strength testing WNL  Lymphadenopathy:     Cervical: No cervical adenopathy.  Skin:    General: Skin is warm and dry.     Coloration: Skin is not jaundiced.     Findings: No bruising, erythema or rash.  Neurological:     Mental Status: She is alert and oriented to person, place, and time.     Cranial Nerves: No cranial nerve deficit.  Psychiatric:        Attention and Perception: Attention normal.        Mood and Affect: Mood and affect normal.        Speech: Speech normal.        Behavior: Behavior normal. Behavior is cooperative.        Thought Content: Thought content normal.        Cognition and Memory: Cognition normal.        Judgment: Judgment normal.     Last CBC Lab Results  Component Value Date   WBC 9.5 05/03/2023   HGB 15.3 (H) 05/03/2023   HCT 45.4 05/03/2023   MCV 90.6 05/03/2023   MCH 30.5 05/03/2023   RDW 13.3 05/03/2023   PLT 254 05/03/2023   Last metabolic panel Lab Results  Component Value Date   GLUCOSE 102 (H) 05/03/2023   NA 140 05/03/2023   K 3.7 05/03/2023  CL 102 05/03/2023   CO2 26 05/03/2023   BUN 16 05/03/2023   CREATININE 0.99 05/03/2023   GFRNONAA 56 (L) 05/03/2023   CALCIUM  9.3 05/03/2023   PROT 7.2 05/03/2023   ALBUMIN 4.1 05/03/2023   LABGLOB 2.5 04/18/2018   AGRATIO 1.9 04/18/2018   BILITOT 1.0 05/03/2023   ALKPHOS 79 05/03/2023   AST 20 05/03/2023   ALT 16 05/03/2023   ANIONGAP 12 05/03/2023   Last lipids Lab Results  Component Value Date   CHOL 228 (H) 05/05/2023   HDL 55 05/05/2023   LDLCALC 149 (H) 05/05/2023   TRIG 121 05/05/2023   CHOLHDL 4.1 05/05/2023   Last hemoglobin A1c Lab Results  Component Value Date    HGBA1C 5.8 (H) 05/04/2023   Last thyroid functions Lab Results  Component Value Date   TSH 1.466 05/05/2023   Last vitamin D  No results found for: 25OHVITD2, 25OHVITD3, VD25OH Last vitamin B12 and Folate No results found for: VITAMINB12, FOLATE    Assessment & Plan:  Primary hypertension Assessment & Plan: History of hypertension, recently started on Amlodipine  10mg  and Lisinopril  10mg  6 days prior. Home BP readings have been in the 150/70 range. Given BP rechecks in office manually, I have significant concern for pseudohypertension given positive Oslers maneuver (due to partially non-compressible vessels.) -Continue current medications; continue home monitoring -Pt already Referred to cardiology for further evaluation; zio patch pending. Call to schedule  Orders: -     CBC with Differential/Platelet -     BASIC METABOLIC PANEL WITH eGFR -     US  RENAL ARTERY DUPLEX COMPLETE; Future -     Lisinopril ; Take 1 tablet (10 mg total) by mouth daily. Skip the dose if systolic BP less than 140 mmHg  Dispense: 90 tablet; Refill: 1 -     amLODIPine  Besylate; Take 1 tablet (10 mg total) by mouth daily. Skip the dose if systolic BP less than 140 mmHg  Dispense: 90 tablet; Refill: 1  Acute CVA (cerebrovascular accident) [facility]) Assessment & Plan: Recent Stroke Recent hospitalization for stroke. No current falls reported. Pt c/o B lower extremity weakness. No speech abnormalities or dysphagia -Will refer to home health assessment - PT, OT and SLP -Physical therapy assessment and treatment as needed. -Continue use of walker, remove all trip hazards at home (ie: throw rugs) -wear supportive shoes or grip socks  Orders: -     Clopidogrel  Bisulfate; Take 1 tablet (75 mg total) by mouth daily.  Dispense: 90 tablet; Refill: 1 -     Atorvastatin  Calcium ; Take 1 tablet (80 mg total) by mouth at bedtime.  Dispense: 90 tablet; Refill: 1 -     Aspirin ; Take 1 tablet (81 mg total) by mouth  daily. Swallow whole.  Dispense: 90 tablet; Refill: 1 -     Ambulatory referral to Home Health  Acute cystitis without hematuria Assessment & Plan: Recent UTI that caused delirium- was successfully treated with antibiotics in the hospital. Urine now appears normal. -pt denies urinary sx, denies residual confusion -Perform urine culture. Pt did not start the cefdinir   Orders: -     POCT urinalysis dipstick -     Urine Culture  Acute encephalopathy Assessment & Plan: resolved  Orders: -     CBC with Differential/Platelet -     BASIC METABOLIC PANEL WITH eGFR -     Ambulatory referral to Home Health  Carotid stenosis, right Assessment & Plan: History of vascular disease with 75% stenosis in the right carotid artery,  50% stenosis of left carotid artery (2020 study showed 65% R, 25% L) -Continue Plavix  and baby aspirin  for antiplatelet therapy. -Schedule follow up with vascular specialist for evaluation of possible carotid endarterectomy vs other intervention  Orders: -     Clopidogrel  Bisulfate; Take 1 tablet (75 mg total) by mouth daily.  Dispense: 90 tablet; Refill: 1 -     Atorvastatin  Calcium ; Take 1 tablet (80 mg total) by mouth at bedtime.  Dispense: 90 tablet; Refill: 1 -     Aspirin ; Take 1 tablet (81 mg total) by mouth daily. Swallow whole.  Dispense: 90 tablet; Refill: 1  Mixed hyperlipidemia -     Clopidogrel  Bisulfate; Take 1 tablet (75 mg total) by mouth daily.  Dispense: 90 tablet; Refill: 1 -     Atorvastatin  Calcium ; Take 1 tablet (80 mg total) by mouth at bedtime.  Dispense: 90 tablet; Refill: 1  Prediabetes Assessment & Plan: Noted on recent hospitalization   Elevated hemoglobin (HCC) -     CBC with Differential/Platelet  Decreased GFR Assessment & Plan: Stable low GFR for several years. -Given significant atherosclerosis and persistent renal impairment, will order renal artery ultrasound to evaluate for renal artery stenosis.  Orders: -     BASIC  METABOLIC PANEL WITH eGFR  Aortic atherosclerosis (HCC) -     US  RENAL ARTERY DUPLEX COMPLETE; Future -     Clopidogrel  Bisulfate; Take 1 tablet (75 mg total) by mouth daily.  Dispense: 90 tablet; Refill: 1 -     Atorvastatin  Calcium ; Take 1 tablet (80 mg total) by mouth at bedtime.  Dispense: 90 tablet; Refill: 1  Calculus of gallbladder without cholecystitis without obstruction Assessment & Plan: Pt asx - this was also noted on CT abdomen 13 years ago. Stable, no further workup indicated   Cardiomegaly  Weakness of both lower extremities Assessment & Plan: Pt c/o this. Physical exam reveals concern for mild LUE and LLE weakness s/p CVA -continue walker use at home -PT/OT/SLP ordered through home health for further assessment   Arthritis Assessment & Plan: Pt with c/o lower back pain, hands show generalized OA -Tylenol  (acetaminophen ) 500-650mg  Q 6-8 hrs as needed -Turmeric 500mg  OTC -Fish Oil 1000mcg daily with EPA/ DHA   Total time spent with patient including face to face time, documentation, plan of care, thorough chart review including personal interpretation of outside imaging studies and labs was 75 minutes.   No follow-ups on file.   Alexa LITTIE Gave, PA

## 2023-05-10 NOTE — Assessment & Plan Note (Signed)
 Pt c/o this. Physical exam reveals concern for mild LUE and LLE weakness s/p CVA -continue walker use at home -PT/OT/SLP ordered through home health for further assessment

## 2023-05-10 NOTE — Assessment & Plan Note (Signed)
 Noted on recent hospitalization

## 2023-05-11 LAB — URINE CULTURE
MICRO NUMBER:: 15922023
Result:: NO GROWTH
SPECIMEN QUALITY:: ADEQUATE

## 2023-05-11 LAB — CBC WITH DIFFERENTIAL/PLATELET
Basophils Absolute: 0.1 10*3/uL (ref 0.0–0.1)
Basophils Relative: 0.8 % (ref 0.0–3.0)
Eosinophils Absolute: 0.2 10*3/uL (ref 0.0–0.7)
Eosinophils Relative: 2.4 % (ref 0.0–5.0)
HCT: 45.7 % (ref 36.0–46.0)
Hemoglobin: 15.3 g/dL — ABNORMAL HIGH (ref 12.0–15.0)
Lymphocytes Relative: 34.4 % (ref 12.0–46.0)
Lymphs Abs: 3.2 10*3/uL (ref 0.7–4.0)
MCHC: 33.5 g/dL (ref 30.0–36.0)
MCV: 92.1 fL (ref 78.0–100.0)
Monocytes Absolute: 0.5 10*3/uL (ref 0.1–1.0)
Monocytes Relative: 5.1 % (ref 3.0–12.0)
Neutro Abs: 5.3 10*3/uL (ref 1.4–7.7)
Neutrophils Relative %: 57.3 % (ref 43.0–77.0)
Platelets: 275 10*3/uL (ref 150.0–400.0)
RBC: 4.96 Mil/uL (ref 3.87–5.11)
RDW: 14.3 % (ref 11.5–15.5)
WBC: 9.3 10*3/uL (ref 4.0–10.5)

## 2023-05-11 LAB — BASIC METABOLIC PANEL WITH GFR
BUN/Creatinine Ratio: 17 (calc) (ref 6–22)
BUN: 19 mg/dL (ref 7–25)
CO2: 26 mmol/L (ref 20–32)
Calcium: 10 mg/dL (ref 8.6–10.4)
Chloride: 103 mmol/L (ref 98–110)
Creat: 1.09 mg/dL — ABNORMAL HIGH (ref 0.60–0.95)
Glucose, Bld: 83 mg/dL (ref 65–99)
Potassium: 4.6 mmol/L (ref 3.5–5.3)
Sodium: 139 mmol/L (ref 135–146)
eGFR: 50 mL/min/{1.73_m2} — ABNORMAL LOW (ref >=60)

## 2023-05-13 DIAGNOSIS — Z8673 Personal history of transient ischemic attack (TIA), and cerebral infarction without residual deficits: Secondary | ICD-10-CM | POA: Diagnosis not present

## 2023-05-13 DIAGNOSIS — I6523 Occlusion and stenosis of bilateral carotid arteries: Secondary | ICD-10-CM | POA: Diagnosis not present

## 2023-05-13 DIAGNOSIS — I10 Essential (primary) hypertension: Secondary | ICD-10-CM | POA: Diagnosis not present

## 2023-05-17 ENCOUNTER — Ambulatory Visit: Payer: Medicare Other | Admitting: Nurse Practitioner

## 2023-05-17 ENCOUNTER — Telehealth: Payer: Self-pay | Admitting: Internal Medicine

## 2023-05-17 NOTE — Telephone Encounter (Signed)
 NP no show. Discharged-Toni

## 2023-05-19 ENCOUNTER — Ambulatory Visit
Admission: RE | Admit: 2023-05-19 | Discharge: 2023-05-19 | Disposition: A | Discharge status: Home / Self Care | Payer: No Typology Code available for payment source | Source: Ambulatory Visit | Attending: Urgent Care | Admitting: Urgent Care

## 2023-05-19 DIAGNOSIS — I700 Atherosclerosis of aorta: Secondary | ICD-10-CM | POA: Insufficient documentation

## 2023-05-19 DIAGNOSIS — I10 Essential (primary) hypertension: Secondary | ICD-10-CM | POA: Diagnosis not present

## 2023-05-19 DIAGNOSIS — N281 Cyst of kidney, acquired: Secondary | ICD-10-CM | POA: Diagnosis not present

## 2023-05-19 DIAGNOSIS — I679 Cerebrovascular disease, unspecified: Secondary | ICD-10-CM | POA: Diagnosis not present

## 2023-05-21 ENCOUNTER — Other Ambulatory Visit: Payer: Self-pay | Admitting: Urgent Care

## 2023-05-21 DIAGNOSIS — I701 Atherosclerosis of renal artery: Secondary | ICD-10-CM

## 2023-05-27 NOTE — Telephone Encounter (Signed)
Called and left message to Kenney Houseman that PT only is fine.

## 2023-05-27 NOTE — Telephone Encounter (Signed)
Please advise   Copied from CRM [id number]. Topic: General - Other >> May 27, 2023  2:46 PM Irine Seal wrote: Reason for CRM:   callback  [phone] if verbal orders are needed to be given or anything else addressed     tanya Q RN with [facility] regarding Referral # [id number] was needing confirmation that it would be okay to accept the referral, stated that they can do the PT but not ST.  Read verbatim the message per the referral notes that stated:  From: Maretta Bees, [place] Sent: 05/27/2023   8:19 AM EST To: Leona Carry Subject: RE: Home Health                                 Yes, that is perfectly fine. She did not appear to have any speech or swallowing issues to necessitate that service.

## 2023-05-28 ENCOUNTER — Telehealth: Payer: Self-pay | Admitting: Urgent Care

## 2023-05-28 NOTE — Telephone Encounter (Deleted)
Home Health Verbal Orders - Caller/Agency: Kim  [facility]  Callback Number: [phone]  Service Requested: Physical Therapy and Skilled Nursing    Frequency: to start services Monday  eval  Any new concerns about the patient? No

## 2023-06-01 ENCOUNTER — Telehealth: Payer: Self-pay

## 2023-06-01 NOTE — Telephone Encounter (Signed)
Copied and pasted CRM   FYI  Reason for CRM: Nurse from [facility] informing that they are delaying the start of care for 130p.

## 2023-06-01 NOTE — Telephone Encounter (Signed)
Noted, thank you

## 2023-06-07 DIAGNOSIS — Z8673 Personal history of transient ischemic attack (TIA), and cerebral infarction without residual deficits: Secondary | ICD-10-CM | POA: Diagnosis not present

## 2023-06-07 DIAGNOSIS — I6523 Occlusion and stenosis of bilateral carotid arteries: Secondary | ICD-10-CM | POA: Diagnosis not present

## 2023-06-07 NOTE — Telephone Encounter (Signed)
Reviewing patients chart, it appears that she will be establishing with a new PCP on Thursday, 06/10/23. Please notify [facility] that I am okay with DC nursing, but that ALL home health orders from now on will have to go to new PCP. Thank you

## 2023-06-07 NOTE — Telephone Encounter (Signed)
Christy advised.

## 2023-06-07 NOTE — Telephone Encounter (Signed)
Copied from CRM [id number]. Topic: Clinical - Home Health Verbal Orders >> Jun 07, 2023  1:30 PM Orinda Kenner C wrote: Caller/Agency: Christy nurse from [facility] [phone] has not been able to see patient. Patient keeps putting them off. Physical therapy be better fit for patient not nursing at this time, maybe later on nursing can be added. Neysa Bonito needs a verbal order. Per patient request no to have nursing at this time. Please advise and call back.

## 2023-06-10 ENCOUNTER — Ambulatory Visit: Payer: No Typology Code available for payment source | Admitting: Nurse Practitioner

## 2023-06-10 ENCOUNTER — Encounter: Payer: Self-pay | Admitting: Nurse Practitioner

## 2023-06-10 VITALS — BP 154/84 | HR 76 | Temp 98.4°F | Ht <= 58 in | Wt 111.2 lb

## 2023-06-10 DIAGNOSIS — L989 Disorder of the skin and subcutaneous tissue, unspecified: Secondary | ICD-10-CM

## 2023-06-10 DIAGNOSIS — E782 Mixed hyperlipidemia: Secondary | ICD-10-CM | POA: Diagnosis not present

## 2023-06-10 DIAGNOSIS — I10 Essential (primary) hypertension: Secondary | ICD-10-CM | POA: Diagnosis not present

## 2023-06-10 DIAGNOSIS — Z8673 Personal history of transient ischemic attack (TIA), and cerebral infarction without residual deficits: Secondary | ICD-10-CM

## 2023-06-10 DIAGNOSIS — R21 Rash and other nonspecific skin eruption: Secondary | ICD-10-CM

## 2023-06-10 MED ORDER — MUPIROCIN 2 % EX OINT: 1.0000 | TOPICAL_OINTMENT | Freq: Two times a day (BID) | CUTANEOUS | 0 refills | Status: AC

## 2023-06-10 NOTE — Progress Notes (Addendum)
 New Patient Office Visit  Subjective   Patient ID: Alexa Ingram, female    DOB: Nov 24, 1940  Age: 83 y.o. MRN: [id number]  CC:  Chief Complaint  Patient presents with   Establish Care  Discussed the use of a AI scribe software for clinical note transcription with the patient, who gave verbal consent to proceed.   HPI Alexa Ingram is an 83 year old female with recent CVA and uncontrolled hypertension who presents to establish care. She is accompanied by her grandson, Alexa Ingram.  Her previous PCP was Ms. [place], [place] .  She had recent  CVA, carotid stenosis hypertension, hyperlipidemia, prediabetes, osteopenia, arthritis.  She reports weakness in the legs.    She had 75% stenosis in the right artery.  She is followed by neurology Dr. Maree and is managed by Plavix , aspirin , and Lipitor .  She is scheduled to see vascular surgeon for evaluation of stent placement.  She is followed by cardiology at [facility] and was placed on Holter monitor.  She is scheduled to see vascular surgery for possible carotid intervention.  She has some hearing loss.  Advised to see audiologist.   Health Maintenance  Topic Date Due   Medicare Annual Wellness (AWV)  Never done   DEXA SCAN  Never done   COVID-19 Vaccine (1 - 2024-25 season) 06/26/2023 (Originally 01/03/2023)   DTaP/Tdap/Td (1 - Tdap) 08/10/2023 (Originally 07/10/1959)   Zoster Vaccines- Shingrix (1 of 2) 08/10/2023 (Originally 07/10/1988)   Pneumonia Vaccine 62+ Years old (1 of 1 - PCV) 11/10/2023 (Originally 07/11/2003)   HPV VACCINES  Aged Out   INFLUENZA VACCINE  Discontinued    There are no preventive care reminders to display for this patient.  Outpatient Encounter Medications as of 06/10/2023  Medication Sig   acetaminophen  (TYLENOL ) 325 MG tablet Take 2 tablets (650 mg total) by mouth 3 (three) times daily as needed for mild pain (pain score 1-3), moderate pain (pain score 4-6), fever or headache (or temp > 37.5 C (99.5 F)).    amLODipine  (NORVASC ) 10 MG tablet Take 1 tablet (10 mg total) by mouth daily. Skip the dose if systolic BP less than 140 mmHg   atorvastatin  (LIPITOR ) 80 MG tablet Take 1 tablet (80 mg total) by mouth at bedtime.   clopidogrel  (PLAVIX ) 75 MG tablet Take 1 tablet (75 mg total) by mouth daily.   lisinopril  (ZESTRIL ) 10 MG tablet Take 1 tablet (10 mg total) by mouth daily. Skip the dose if systolic BP less than 140 mmHg   mupirocin  ointment (BACTROBAN ) 2 % Apply 1 Application topically 2 (two) times daily.   [DISCONTINUED] vitamin C (ASCORBIC ACID) 500 MG tablet Take 500 mg by mouth daily.   aspirin  EC 81 MG tablet Take 1 tablet (81 mg total) by mouth daily. Swallow whole.   [DISCONTINUED] potassium chloride  SA (K-DUR,KLOR-CON ) 20 MEQ tablet Take 1 tablet (20 mEq total) by mouth daily.   No facility-administered encounter medications on file as of 06/10/2023.    Past Medical History:  Diagnosis Date   Chicken pox    GERD (gastroesophageal reflux disease)    Hypertension    Osteopenia    Pure hypercholesterolemia    Stroke [facility])    Urinary tract infection     Past Surgical History:  Procedure Laterality Date   ABDOMINAL HYSTERECTOMY  1976   ovaries remain   BREAST BIOPSY Right 1986   neg    Family History  Problem Relation Age of Onset   Osteoarthritis Mother  Stroke Mother    Hypertension Mother    Breast cancer Sister 59   Stroke Sister    Healthy Daughter    Healthy Son    Breast cancer Paternal Aunt 3    Social History   Socioeconomic History   Marital status: Single    Spouse name: Not on file   Number of children: Not on file   Years of education: Not on file   Highest education level: High school graduate  Occupational History   Not on file  Tobacco Use   Smoking status: Never   Smokeless tobacco: Never  Vaping Use   Vaping status: Never Used  Substance and Sexual Activity   Alcohol use: Not Currently   Drug use: Never   Sexual activity: Yes  Other  Topics Concern   Not on file  Social History Narrative   ** Merged History Encounter **       Social Drivers of Health   Financial Resource Strain: Low Risk  (05/19/2023)   Received from [facility]   Overall Financial Resource Strain (CARDIA)    Difficulty of Paying Living Expenses: Not hard at all  Food Insecurity: No Food Insecurity (05/19/2023)   Received from [facility]   Hunger Vital Sign    Worried About Running Out of Food in the Last Year: Never true    Ran Out of Food in the Last Year: Never true  Transportation Needs: No Transportation Needs (05/19/2023)   Received from [facility] - Transportation    In the past 12 months, has lack of transportation kept you from medical appointments or from getting medications?: No    Lack of Transportation (Non-Medical): No  Physical Activity: Not on file  Stress: Not on file  Social Connections: Socially Isolated (05/04/2023)   Social Connection and Isolation Panel [NHANES]    Frequency of Communication with Friends and Family: Three times a week    Frequency of Social Gatherings with Friends and Family: Three times a week    Attends Religious Services: Never    Active Member of Clubs or Organizations: No    Attends Banker Meetings: Never    Marital Status: Divorced  Catering Manager Violence: Not At Risk (05/04/2023)   Humiliation, Afraid, Rape, and Kick questionnaire    Fear of Current or Ex-Partner: No    Emotionally Abused: No    Physically Abused: No    Sexually Abused: No    ROS Negative unless indicated in HPI.      Objective    BP (!) 154/84   Pulse 76   Temp 98.4 F (36.9 C)   Ht 4' 10 (1.473 m)   Wt 111 lb 3.2 oz (50.4 kg)   SpO2 97%   BMI 23.24 kg/m   Physical Exam Constitutional:      Appearance: Normal appearance.  HENT:     Nose: Nose normal.  Eyes:     Pupils: Pupils are equal, round, and reactive to light.   Cardiovascular:     Rate and Rhythm: Normal rate and regular rhythm.     Pulses: Normal pulses.     Heart sounds: Normal heart sounds.  Abdominal:     General: Bowel sounds are normal.     Palpations: Abdomen is soft.     Tenderness: There is no abdominal tenderness. There is no right CVA tenderness or left CVA tenderness.  Musculoskeletal:     Cervical back: Normal  range of motion.  Skin:    General: Skin is warm.     Findings: Lesion (dime size lesion on the right upper leg) present.  Neurological:     General: No focal deficit present.     Mental Status: She is alert. Mental status is at baseline.  Psychiatric:        Mood and Affect: Mood normal.        Behavior: Behavior normal.        Thought Content: Thought content normal.        Judgment: Judgment normal.         Assessment & Plan:  Primary hypertension Assessment & Plan: History of hypertension, recently started on Amlodipine  10mg  and Lisinopril  10mg  6 days prior. Home BP readings have been in the 150/70 range. Blood pressure readings have been variable, with recent readings in the 140s-150s systolic. Patient is currently on Lisinopril  and Amlodipine . -Continue current antihypertensive medications and follow-up with blood pressure readings in 2 weeks   Mixed hyperlipidemia Assessment & Plan: Lab Results  Component Value Date   CHOL 228 (H) 05/05/2023   HDL 55 05/05/2023   LDLCALC 149 (H) 05/05/2023   TRIG 121 05/05/2023   CHOLHDL 4.1 05/05/2023  Continue statin therapy for cardiovascular risk reduction.    H/O: stroke Assessment & Plan: Patient followed by neurology. Managed by Plavix  and Lipitor    Skin lesion Assessment & Plan: Lesion on the R hip that is tender and has been treated with peroxide and bandages. -Discontinue use of peroxide and bandages. -Start antibiotic cream. -Follow-up in one week to assess healing.   Other orders -     Mupirocin ; Apply 1 Application topically 2 (two)  times daily.  Dispense: 22 g; Refill: 0    Return in about 1 week (around 06/17/2023) for rash.   Dakiya Puopolo, NP

## 2023-06-10 NOTE — Patient Instructions (Signed)
 Please monitor blood pressure daily at same time and bring the numbers at the next visit.  Please bring your BP monitor during the next visit.

## 2023-06-11 ENCOUNTER — Telehealth: Payer: Self-pay

## 2023-06-11 NOTE — Telephone Encounter (Signed)
 Copied from CRM [id number]. Topic: Clinical - Home Health Verbal Orders >> Jun 11, 2023  2:22 PM Evie B wrote: Caller/Agency: [facility]  Callback Number: [phone] Service Requested: home health, PT Frequency: Initial evaluation If order is signed pt with have a start of care 06/15/23 Any new concerns about the patient? No

## 2023-06-15 ENCOUNTER — Telehealth: Payer: Self-pay

## 2023-06-15 NOTE — Telephone Encounter (Signed)
Copied from CRM [id number]. Topic: Clinical - Home Health Verbal Orders >> Jun 14, 2023  4:44 PM Denese Killings wrote: Caller/Agency: [facility]  Callback Number: [phone] Service Requested: Physical Therapy Frequency: Neysa Bonito wanted to let nurse or doctor know that they are trying to see patient tomorrow  Any new concerns about the patient? No

## 2023-06-15 NOTE — Telephone Encounter (Signed)
Pt established care with a new PCP in [place]. The new PCP needs to discuss any changes to home health orders

## 2023-06-16 NOTE — Telephone Encounter (Signed)
LVM for christy with [facility].

## 2023-06-17 ENCOUNTER — Ambulatory Visit: Payer: No Typology Code available for payment source | Admitting: Nurse Practitioner

## 2023-06-17 ENCOUNTER — Ambulatory Visit: Payer: No Typology Code available for payment source | Admitting: Urgent Care

## 2023-06-17 NOTE — Telephone Encounter (Signed)
Communication  Caller/Agency: [facility] .    Callback Number: [phone]    Service Requested: Physical Therapy and Skilled Nursing    Frequency: n/a    Any new concerns about the patient? No    *wanted to let someone know that they were unable to see patient today due to her not answering the phone. They stated that they have been trying to reach her for quite sometime   Pt no longer established at this office.

## 2023-06-18 ENCOUNTER — Ambulatory Visit: Payer: No Typology Code available for payment source | Admitting: Urgent Care

## 2023-06-19 DIAGNOSIS — Z8673 Personal history of transient ischemic attack (TIA), and cerebral infarction without residual deficits: Secondary | ICD-10-CM | POA: Insufficient documentation

## 2023-06-19 DIAGNOSIS — L989 Disorder of the skin and subcutaneous tissue, unspecified: Secondary | ICD-10-CM | POA: Insufficient documentation

## 2023-06-19 DIAGNOSIS — I739 Peripheral vascular disease, unspecified: Secondary | ICD-10-CM | POA: Insufficient documentation

## 2023-06-19 DIAGNOSIS — I701 Atherosclerosis of renal artery: Secondary | ICD-10-CM | POA: Insufficient documentation

## 2023-06-19 NOTE — Assessment & Plan Note (Signed)
 Lesion on the R hip that is tender and has been treated with peroxide and bandages. -Discontinue use of peroxide and bandages. -Start antibiotic cream. -Follow-up in one week to assess healing.

## 2023-06-19 NOTE — Assessment & Plan Note (Signed)
 History of hypertension, recently started on Amlodipine 10mg  and Lisinopril 10mg  6 days prior. Home BP readings have been in the 150/70 range. Blood pressure readings have been variable, with recent readings in the 140s-150s systolic. Patient is currently on Lisinopril and Amlodipine. -Continue current antihypertensive medications and follow-up with blood pressure readings in 2 weeks

## 2023-06-19 NOTE — Assessment & Plan Note (Signed)
 Lab Results  Component Value Date   CHOL 228 (H) 05/05/2023   HDL 55 05/05/2023   LDLCALC 149 (H) 05/05/2023   TRIG 121 05/05/2023   CHOLHDL 4.1 05/05/2023  Continue statin therapy for cardiovascular risk reduction.

## 2023-06-19 NOTE — Progress Notes (Unsigned)
 MRN : [id number]  Alexa Ingram is a 83 y.o. (1940-08-18) female who presents with chief complaint of check circulation.  History of Present Illness:   The patient is seen for evaluation of malignant hypertension which has been very difficult to control. The patient has a long history of hypertension which recently has become increasingly difficult to control utilizing medical therapy. The patient has consistently documented systolic blood pressures near 409 with diastolic pressures over 90.   The patient does have family history of hypertension.   There is no prior documented abdominal bruit. The patient occasionally has flushing symptoms but denies palpitations. No episodes of syncope. There is no history of headache. There is no history of flash pulmonary edema.  The patient denies a history of renal disease.  CT for renal stone evaluation dated May 04, 2023 is reviewed by me personally there is extensive calcification of the abdominal aorta.  There does not appear to be extensive calcifications at the origins of the renal or mesenteric arteries.  Duplex ultrasound of the renal arteries dated May 19, 2023 is reviewed by me and does show 50 to 70% stenosis at the origins of both the right and left renal arteries.  No recent shortening of the patient's walking distance or new symptoms consistent with claudication.  No history of rest pain symptoms. No new ulcers or wounds of the lower extremities have occurred.  The patient denies amaurosis fugax or recent TIA symptoms. There are no recent neurological changes noted. There is no history of DVT, PE or superficial thrombophlebitis. No recent episodes of angina or shortness of breath documented.   CT angiogram dated 05/04/2023 is reviewed by me personally:  Acute infarct in the anterolateral aspect of the right thalamus and posterior limb of the right  internal capsule. No acute hemorrhage. 2.    Limited evaluation of the carotid bifurcations due to motion artifact. Within this limitation, mixed plaque results in at least >80% stenosis of the right ICA origin and 50% stenosis of the distal left CCA.  Duplex ultrasound of the renal arteries demonstrates ***  No outpatient medications have been marked as taking for the 06/21/23 encounter (Appointment) with Gilda Crease, Latina Craver, MD.    Past Medical History:  Diagnosis Date   Chicken pox    GERD (gastroesophageal reflux disease)    Hypertension    Osteopenia    Pure hypercholesterolemia    Stroke [facility])    Urinary tract infection     Past Surgical History:  Procedure Laterality Date   ABDOMINAL HYSTERECTOMY  1976   ovaries remain   BREAST BIOPSY Right 1986   neg    Social History Social History   Tobacco Use   Smoking status: Never   Smokeless tobacco: Never  Vaping Use   Vaping status: Never Used  Substance Use Topics   Alcohol use: Not Currently   Drug use: Never    Family History Family History  Problem Relation Age of Onset   Osteoarthritis Mother    Stroke Mother    Hypertension Mother    Breast cancer Sister 52  Stroke Sister    Healthy Daughter    Healthy Son    Breast cancer Paternal Aunt 70    Allergies  Allergen Reactions   Sulfa Antibiotics Itching   Sulfate    Penicillin G Rash     REVIEW OF SYSTEMS (Negative unless checked)  Constitutional: [] Weight loss  [] Fever  [] Chills Cardiac: [] Chest pain   [] Chest pressure   [] Palpitations   [] Shortness of breath when laying flat   [] Shortness of breath with exertion. Vascular:  [x] Pain in legs with walking   [] Pain in legs at rest  [] History of DVT   [] Phlebitis   [] Swelling in legs   [] Varicose veins   [] Non-healing ulcers Pulmonary:   [] Uses home oxygen   [] Productive cough   [] Hemoptysis   [] Wheeze  [] COPD   [] Asthma Neurologic:  [] Dizziness   [] Seizures   [] History of stroke   [] History of TIA   [] Aphasia   [] Vissual changes   [] Weakness or numbness in arm   [] Weakness or numbness in leg Musculoskeletal:   [] Joint swelling   [] Joint pain   [] Low back pain Hematologic:  [] Easy bruising  [] Easy bleeding   [] Hypercoagulable state   [] Anemic Gastrointestinal:  [] Diarrhea   [] Vomiting  [] Gastroesophageal reflux/heartburn   [] Difficulty swallowing. Genitourinary:  [] Chronic kidney disease   [] Difficult urination  [] Frequent urination   [] Blood in urine Skin:  [] Rashes   [] Ulcers  Psychological:  [] History of anxiety   []  History of major depression.  Physical Examination  There were no vitals filed for this visit. There is no height or weight on file to calculate BMI. Gen: WD/WN, NAD Head: [place]/AT, No temporalis wasting.  Ear/Nose/Throat: Hearing grossly intact, nares w/o erythema or drainage Eyes: PER, EOMI, sclera nonicteric.  Neck: Supple, no masses.  No bruit or JVD.  Pulmonary:  Good air movement, no audible wheezing, no use of accessory muscles.  Cardiac: RRR, normal S1, S2, no Murmurs. Vascular:  mild trophic changes, no open wounds Vessel Right Left  Radial Palpable Palpable  PT Not Palpable Not Palpable  DP Not Palpable Not Palpable  Gastrointestinal: soft, non-distended. No guarding/no peritoneal signs.  Musculoskeletal: M/S 5/5 throughout.  No visible deformity.  Neurologic: CN 2-12 intact. Pain and light touch intact in extremities.  Symmetrical.  Speech is fluent. Motor exam as listed above. Psychiatric: Judgment intact, Mood & affect appropriate for pt's clinical situation. Dermatologic: No rashes or ulcers noted.  No changes consistent with cellulitis.   CBC Lab Results  Component Value Date   WBC 9.3 05/10/2023   HGB 15.3 (H) 05/10/2023   HCT 45.7 05/10/2023   MCV 92.1 05/10/2023   PLT 275.0 05/10/2023    BMET    Component Value Date/Time   NA 139 05/10/2023 1501   NA 144 04/18/2018 1142   K 4.6 05/10/2023 1501   CL 103 05/10/2023 1501   CO2 26  05/10/2023 1501   GLUCOSE 83 05/10/2023 1501   BUN 19 05/10/2023 1501   BUN 15 04/18/2018 1142   CREATININE 1.09 (H) 05/10/2023 1501   CALCIUM 10.0 05/10/2023 1501   GFRNONAA 56 (L) 05/03/2023 1622   GFRAA 51 (L) 05/21/2018 0910   CrCl cannot be calculated (Patient's most recent lab result is older than the maximum 21 days allowed.).  COAG No results found for: "INR", "PROTIME"  Radiology No results found.   Assessment/Plan There are no diagnoses linked to this encounter.   Levora Dredge, MD  06/19/2023 4:00 PM

## 2023-06-19 NOTE — Assessment & Plan Note (Signed)
 Patient followed by neurology. Managed by Plavix and Lipitor

## 2023-06-21 ENCOUNTER — Encounter (INDEPENDENT_AMBULATORY_CARE_PROVIDER_SITE_OTHER): Payer: Self-pay | Admitting: Vascular Surgery

## 2023-06-21 ENCOUNTER — Ambulatory Visit (INDEPENDENT_AMBULATORY_CARE_PROVIDER_SITE_OTHER): Payer: No Typology Code available for payment source | Admitting: Vascular Surgery

## 2023-06-21 VITALS — BP 141/70 | HR 79 | Resp 18 | Ht 59.0 in | Wt 112.0 lb

## 2023-06-21 DIAGNOSIS — I6521 Occlusion and stenosis of right carotid artery: Secondary | ICD-10-CM | POA: Diagnosis not present

## 2023-06-21 DIAGNOSIS — E782 Mixed hyperlipidemia: Secondary | ICD-10-CM | POA: Diagnosis not present

## 2023-06-21 DIAGNOSIS — I10 Essential (primary) hypertension: Secondary | ICD-10-CM

## 2023-06-21 DIAGNOSIS — I701 Atherosclerosis of renal artery: Secondary | ICD-10-CM

## 2023-06-21 DIAGNOSIS — I739 Peripheral vascular disease, unspecified: Secondary | ICD-10-CM

## 2023-06-22 ENCOUNTER — Encounter: Payer: Self-pay | Admitting: Nurse Practitioner

## 2023-06-22 ENCOUNTER — Ambulatory Visit (INDEPENDENT_AMBULATORY_CARE_PROVIDER_SITE_OTHER): Payer: No Typology Code available for payment source | Admitting: Nurse Practitioner

## 2023-06-22 VITALS — BP 140/75 | HR 86 | Temp 98.2°F | Ht 59.0 in | Wt 111.8 lb

## 2023-06-22 DIAGNOSIS — L989 Disorder of the skin and subcutaneous tissue, unspecified: Secondary | ICD-10-CM

## 2023-06-22 DIAGNOSIS — R7303 Prediabetes: Secondary | ICD-10-CM | POA: Diagnosis not present

## 2023-06-22 DIAGNOSIS — I10 Essential (primary) hypertension: Secondary | ICD-10-CM

## 2023-06-22 NOTE — Progress Notes (Signed)
 Established Patient Office Visit  Subjective:  Patient ID: Alexa Ingram, female    DOB: Mar 09, 1941  Age: 83 y.o. MRN: [id number]  CC:  Chief Complaint  Patient presents with   Follow-up    rash    HPI  [name] presents for follow up on rash accompanied by her grandson. The rash has been improving with the use of Bactroban ointment. The rash is no longer itchy, and she uses gauze to prevent the ointment from staining her clothes. No current itching or pain from the rash.   HPI   Past Medical History:  Diagnosis Date   Chicken pox    GERD (gastroesophageal reflux disease)    Hypertension    Osteopenia    Pure hypercholesterolemia    Stroke (HCC)    Urinary tract infection     Past Surgical History:  Procedure Laterality Date   ABDOMINAL HYSTERECTOMY  1976   ovaries remain   BREAST BIOPSY Right 1986   neg    Family History  Problem Relation Age of Onset   Osteoarthritis Mother    Stroke Mother    Hypertension Mother    Breast cancer Sister 35   Stroke Sister    Healthy Daughter    Healthy Son    Breast cancer Paternal Aunt 25    Social History   Socioeconomic History   Marital status: Single    Spouse name: Not on file   Number of children: Not on file   Years of education: Not on file   Highest education level: High school graduate  Occupational History   Not on file  Tobacco Use   Smoking status: Never   Smokeless tobacco: Never  Vaping Use   Vaping status: Never Used  Substance and Sexual Activity   Alcohol use: Not Currently   Drug use: Never   Sexual activity: Yes  Other Topics Concern   Not on file  Social History Narrative   ** Merged History Encounter **       Social Drivers of Health   Financial Resource Strain: Low Risk  (05/19/2023)   Received from [facility]   Overall Financial Resource Strain (CARDIA)    Difficulty of Paying Living Expenses: Not hard at all  Food Insecurity: No Food  Insecurity (05/19/2023)   Received from [facility]   Hunger Vital Sign    Worried About Running Out of Food in the Last Year: Never true    Ran Out of Food in the Last Year: Never true  Transportation Needs: No Transportation Needs (05/19/2023)   Received from [facility] - Transportation    In the past 12 months, has lack of transportation kept you from medical appointments or from getting medications?: No    Lack of Transportation (Non-Medical): No  Physical Activity: Not on file  Stress: Not on file  Social Connections: Socially Isolated (05/04/2023)   Social Connection and Isolation Panel [NHANES]    Frequency of Communication with Friends and Family: Three times a week    Frequency of Social Gatherings with Friends and Family: Three times a week    Attends Religious Services: Never    Active Member of Clubs or Organizations: No    Attends Banker Meetings: Never    Marital Status: Divorced  Catering manager Violence: Not At Risk (05/04/2023)   Humiliation, Afraid, Rape, and Kick questionnaire    Fear of Current or Ex-Partner: No  Emotionally Abused: No    Physically Abused: No    Sexually Abused: No     Outpatient Medications Prior to Visit  Medication Sig Dispense Refill   acetaminophen (TYLENOL) 325 MG tablet Take 2 tablets (650 mg total) by mouth 3 (three) times daily as needed for mild pain (pain score 1-3), moderate pain (pain score 4-6), fever or headache (or temp > 37.5 C (99.5 F)).     amLODipine (NORVASC) 10 MG tablet Take 1 tablet (10 mg total) by mouth daily. Skip the dose if systolic BP less than 140 mmHg 90 tablet 1   aspirin EC 81 MG tablet Take 1 tablet (81 mg total) by mouth daily. Swallow whole. 90 tablet 1   atorvastatin (LIPITOR) 80 MG tablet Take 1 tablet (80 mg total) by mouth at bedtime. 90 tablet 1   clopidogrel (PLAVIX) 75 MG tablet Take 1 tablet (75 mg total) by mouth daily. 90 tablet 1    lisinopril (ZESTRIL) 10 MG tablet Take 1 tablet (10 mg total) by mouth daily. Skip the dose if systolic BP less than 140 mmHg 90 tablet 1   mupirocin ointment (BACTROBAN) 2 % Apply 1 Application topically 2 (two) times daily. 22 g 0   No facility-administered medications prior to visit.    Allergies  Allergen Reactions   Sulfa Antibiotics Itching   Sulfate    Penicillin G Rash    ROS Review of Systems Negative unless indicated in HPI.    Objective:    Physical Exam Constitutional:      Appearance: Normal appearance.  HENT:     Nose: Nose normal.  Eyes:     Pupils: Pupils are equal, round, and reactive to light.  Cardiovascular:     Rate and Rhythm: Normal rate and regular rhythm.     Pulses: Normal pulses.     Heart sounds: Normal heart sounds.  Musculoskeletal:     Cervical back: Normal range of motion.  Skin:    General: Skin is warm.     Findings: Lesion (dime size lesion on the right upper leg) present.  Neurological:     General: No focal deficit present.     Mental Status: She is alert. Mental status is at baseline.  Psychiatric:        Mood and Affect: Mood normal.        Behavior: Behavior normal.        Thought Content: Thought content normal.        Judgment: Judgment normal.     BP (!) 140/75   Pulse 86   Temp 98.2 F (36.8 C)   Ht 4\' 11"  (1.499 m)   Wt 111 lb 12.8 oz (50.7 kg)   SpO2 98%   BMI 22.58 kg/m  Wt Readings from Last 3 Encounters:  06/22/23 111 lb 12.8 oz (50.7 kg)  06/21/23 112 lb (50.8 kg)  06/10/23 111 lb 3.2 oz (50.4 kg)     Health Maintenance  Topic Date Due   Medicare Annual Wellness (AWV)  Never done   DEXA SCAN  Never done   COVID-19 Vaccine (1 - 2024-25 season) Never done   DTaP/Tdap/Td (1 - Tdap) 08/10/2023 (Originally 07/10/1959)   Zoster Vaccines- Shingrix (1 of 2) 08/10/2023 (Originally 07/10/1988)   Pneumonia Vaccine 27+ Years old (1 of 1 - PCV) 11/10/2023 (Originally 07/11/2003)   HPV VACCINES  Aged Out   INFLUENZA  VACCINE  Discontinued    There are no preventive care reminders to display for this  patient.  Lab Results  Component Value Date   TSH 1.466 05/05/2023   Lab Results  Component Value Date   WBC 9.3 05/10/2023   HGB 15.3 (H) 05/10/2023   HCT 45.7 05/10/2023   MCV 92.1 05/10/2023   PLT 275.0 05/10/2023   Lab Results  Component Value Date   NA 139 05/10/2023   K 4.6 05/10/2023   CO2 26 05/10/2023   GLUCOSE 83 05/10/2023   BUN 19 05/10/2023   CREATININE 1.09 (H) 05/10/2023   BILITOT 1.0 05/03/2023   ALKPHOS 79 05/03/2023   AST 20 05/03/2023   ALT 16 05/03/2023   PROT 7.2 05/03/2023   ALBUMIN 4.1 05/03/2023   CALCIUM 10.0 05/10/2023   ANIONGAP 12 05/03/2023   EGFR 50 (L) 05/10/2023   Lab Results  Component Value Date   CHOL 228 (H) 05/05/2023   Lab Results  Component Value Date   HDL 55 05/05/2023   Lab Results  Component Value Date   LDLCALC 149 (H) 05/05/2023   Lab Results  Component Value Date   TRIG 121 05/05/2023   Lab Results  Component Value Date   CHOLHDL 4.1 05/05/2023   Lab Results  Component Value Date   HGBA1C 5.8 (H) 05/04/2023      Assessment & Plan:  Skin lesion Assessment & Plan: Improvement noted with Bactroban ointment. No current itchiness or pain. -Continue Bactroban ointment for 2-3 more days. -Leave open to air for a few days to observe healing. -Cover with bandage when showering to prevent wetting.   Hypertension, unspecified type -     Comprehensive metabolic panel; Future -     Lipid panel; Future -     VITAMIN D 25 Hydroxy (Vit-D Deficiency, Fractures); Future -     Vitamin B12; Future  Prediabetes -     Comprehensive metabolic panel; Future -     Lipid panel; Future -     VITAMIN D 25 Hydroxy (Vit-D Deficiency, Fractures); Future -     Vitamin B12; Future    Follow-up: Return in about 3 months (around 09/19/2023) for chronic management, follow up with fasting lab 2 days prior.   Kara Dies, NP

## 2023-06-24 ENCOUNTER — Encounter (INDEPENDENT_AMBULATORY_CARE_PROVIDER_SITE_OTHER): Payer: Self-pay | Admitting: Vascular Surgery

## 2023-06-28 NOTE — Assessment & Plan Note (Signed)
 Improvement noted with Bactroban ointment. No current itchiness or pain. -Continue Bactroban ointment for 2-3 more days. -Leave open to air for a few days to observe healing. -Cover with bandage when showering to prevent wetting.

## 2023-07-28 NOTE — Progress Notes (Unsigned)
 MRN : [id number]  Alexa Ingram is a 83 y.o. (1940-07-22) female who presents with chief complaint of check carotid arteries.  History of Present Illness:   The patient is seen for follow up evaluation of carotid stenosis. The carotid stenosis followed by ultrasound.   The patient denies amaurosis fugax. There is no recent history of TIA symptoms or focal motor deficits. There is no prior documented CVA.  The patient is taking enteric-coated aspirin 81 mg daily.  There is no history of migraine headaches. There is no history of seizures.  No recent shortening of the patient's walking distance or new symptoms consistent with claudication.  No history of rest pain symptoms. No new ulcers or wounds of the lower extremities have occurred.  There is no history of DVT, PE or superficial thrombophlebitis. No documented recent episodes of angina or shortness of breath documented.   Carotid Duplex done today shows RICA 1-39% and LICA 40-59%.  Vertebral arteries demonstrate bilateral antegrade flow.  No outpatient medications have been marked as taking for the 07/29/23 encounter (Appointment) with Gilda Crease, Latina Craver, MD.    Past Medical History:  Diagnosis Date   Chicken pox    GERD (gastroesophageal reflux disease)    Hypertension    Osteopenia    Pure hypercholesterolemia    Stroke [facility])    Urinary tract infection     Past Surgical History:  Procedure Laterality Date   ABDOMINAL HYSTERECTOMY  1976   ovaries remain   BREAST BIOPSY Right 1986   neg    Social History Social History   Tobacco Use   Smoking status: Never   Smokeless tobacco: Never  Vaping Use   Vaping status: Never Used  Substance Use Topics   Alcohol use: Not Currently   Drug use: Never    Family History Family History  Problem Relation Age of Onset   Osteoarthritis Mother    Stroke Mother    Hypertension Mother    Breast cancer Sister 58   Stroke Sister    Healthy  Daughter    Healthy Son    Breast cancer Paternal Aunt 74    Allergies  Allergen Reactions   Sulfa Antibiotics Itching   Sulfate    Penicillin G Rash     REVIEW OF SYSTEMS (Negative unless checked)  Constitutional: [] Weight loss  [] Fever  [] Chills Cardiac: [] Chest pain   [] Chest pressure   [] Palpitations   [] Shortness of breath when laying flat   [] Shortness of breath with exertion. Vascular:  [x] Pain in legs with walking   [] Pain in legs at rest  [] History of DVT   [] Phlebitis   [] Swelling in legs   [] Varicose veins   [] Non-healing ulcers Pulmonary:   [] Uses home oxygen   [] Productive cough   [] Hemoptysis   [] Wheeze  [] COPD   [] Asthma Neurologic:  [] Dizziness   [] Seizures   [] History of stroke   [] History of TIA  [] Aphasia   [] Vissual changes   [] Weakness or numbness in arm   [] Weakness or numbness in leg Musculoskeletal:   [] Joint swelling   [] Joint pain   [] Low back pain Hematologic:  [] Easy bruising  [] Easy bleeding   [] Hypercoagulable state   [] Anemic Gastrointestinal:  [] Diarrhea   [] Vomiting  [] Gastroesophageal reflux/heartburn   [] Difficulty swallowing. Genitourinary:  [] Chronic kidney disease   [] Difficult urination  [] Frequent urination   [] Blood in  urine Skin:  [] Rashes   [] Ulcers  Psychological:  [] History of anxiety   []  History of major depression.  Physical Examination  There were no vitals filed for this visit. There is no height or weight on file to calculate BMI. Gen: WD/WN, NAD Head: [place]/AT, No temporalis wasting.  Ear/Nose/Throat: Hearing grossly intact, nares w/o erythema or drainage Eyes: PER, EOMI, sclera nonicteric.  Neck: Supple, no masses.  No bruit or JVD.  Pulmonary:  Good air movement, no audible wheezing, no use of accessory muscles.  Cardiac: RRR, normal S1, S2, no Murmurs. Vascular:  carotid bruit noted Vessel Right Left  Radial Palpable Palpable  Carotid  Palpable  Palpable  Subclav  Palpable Palpable  Gastrointestinal: soft, non-distended.  No guarding/no peritoneal signs.  Musculoskeletal: M/S 5/5 throughout.  No visible deformity.  Neurologic: CN 2-12 intact. Pain and light touch intact in extremities.  Symmetrical.  Speech is fluent. Motor exam as listed above. Psychiatric: Judgment intact, Mood & affect appropriate for pt's clinical situation. Dermatologic: No rashes or ulcers noted.  No changes consistent with cellulitis.   CBC Lab Results  Component Value Date   WBC 9.3 05/10/2023   HGB 15.3 (H) 05/10/2023   HCT 45.7 05/10/2023   MCV 92.1 05/10/2023   PLT 275.0 05/10/2023    BMET    Component Value Date/Time   NA 139 05/10/2023 1501   NA 144 04/18/2018 1142   K 4.6 05/10/2023 1501   CL 103 05/10/2023 1501   CO2 26 05/10/2023 1501   GLUCOSE 83 05/10/2023 1501   BUN 19 05/10/2023 1501   BUN 15 04/18/2018 1142   CREATININE 1.09 (H) 05/10/2023 1501   CALCIUM 10.0 05/10/2023 1501   GFRNONAA 56 (L) 05/03/2023 1622   GFRAA 51 (L) 05/21/2018 0910   CrCl cannot be calculated (Patient's most recent lab result is older than the maximum 21 days allowed.).  COAG No results found for: "INR", "PROTIME"  Radiology No results found.   Assessment/Plan 1. Carotid stenosis, right (Primary) Recommend: At this point I wish to further evaluate the carotid stenosis.   Continue antiplatelet therapy as prescribed Continue management of CAD, HTN and Hyperlipidemia Healthy heart diet,  encouraged exercise at least 4 times per week   Follow up in 6 months with duplex ultrasound and physical exam  - VAS US CAROTID; Future  2. Renal artery stenosis (HCC) Recommend:   The patient has evidence of atherosclerotic changes of the renal artery.  At this time the patient's blood pressure is fairly well controlled.   At this point in time we did discuss the possibility of angiography and stent placement however, patient does not require angiography of the renal artery given the good control of his hypertension.     However,  if at any point the patient's BP becomes acutely worse then further evaluation of the renal artery stenosis and possible intervention would be encouraged.   The patient voices understanding of this plan and agrees.   Patient will follow-up here in the office with renal artery duplex ultrasound as ordered. - VAS US RENAL ARTERY DUPLEX; Future  3. PAD (peripheral artery disease) (HCC) Recommend:   The patient has evidence of atherosclerosis of the lower extremities with claudication.  The patient does not voice lifestyle limiting changes at this point in time.   Noninvasive studies do not suggest clinically significant change.   No invasive studies, angiography or surgery at this time The patient should continue walking and begin a more formal exercise  program.  The patient should continue antiplatelet therapy and aggressive treatment of the lipid abnormalities   No changes in the patient's medications at this time   Continued surveillance is indicated as atherosclerosis is likely to progress with time.     The patient will continue follow up with noninvasive studies as ordered.   4. Primary hypertension Continue antihypertensive medications as already ordered, these medications have been reviewed and there are no changes at this time.  5. Mixed hyperlipidemia Continue statin as ordered and reviewed, no changes at this time d   Levora Dredge, MD  07/28/2023 8:49 PM

## 2023-07-29 ENCOUNTER — Encounter (INDEPENDENT_AMBULATORY_CARE_PROVIDER_SITE_OTHER): Payer: Self-pay | Admitting: Vascular Surgery

## 2023-07-29 ENCOUNTER — Ambulatory Visit (INDEPENDENT_AMBULATORY_CARE_PROVIDER_SITE_OTHER): Payer: No Typology Code available for payment source | Admitting: Vascular Surgery

## 2023-07-29 ENCOUNTER — Ambulatory Visit (INDEPENDENT_AMBULATORY_CARE_PROVIDER_SITE_OTHER): Payer: No Typology Code available for payment source

## 2023-07-29 VITALS — BP 122/75 | HR 87 | Resp 15 | Wt 105.6 lb

## 2023-07-29 DIAGNOSIS — I6521 Occlusion and stenosis of right carotid artery: Secondary | ICD-10-CM

## 2023-07-29 DIAGNOSIS — I701 Atherosclerosis of renal artery: Secondary | ICD-10-CM

## 2023-07-29 DIAGNOSIS — I739 Peripheral vascular disease, unspecified: Secondary | ICD-10-CM

## 2023-07-29 DIAGNOSIS — E782 Mixed hyperlipidemia: Secondary | ICD-10-CM

## 2023-07-29 DIAGNOSIS — I10 Essential (primary) hypertension: Secondary | ICD-10-CM

## 2023-09-02 DIAGNOSIS — I6523 Occlusion and stenosis of bilateral carotid arteries: Secondary | ICD-10-CM | POA: Diagnosis not present

## 2023-09-02 DIAGNOSIS — Z8673 Personal history of transient ischemic attack (TIA), and cerebral infarction without residual deficits: Secondary | ICD-10-CM | POA: Diagnosis not present

## 2023-09-02 DIAGNOSIS — I10 Essential (primary) hypertension: Secondary | ICD-10-CM | POA: Diagnosis not present

## 2023-09-10 ENCOUNTER — Other Ambulatory Visit: Payer: Self-pay | Admitting: Nurse Practitioner

## 2023-09-10 DIAGNOSIS — I10 Essential (primary) hypertension: Secondary | ICD-10-CM

## 2023-09-10 NOTE — Telephone Encounter (Unsigned)
 Copied from CRM [id number]. Topic: Clinical - Medication Refill >> Sep 10, 2023  1:02 PM Magdalene School wrote: Medication: lisinopril  (ZESTRIL ) 10 MG tablet clopidogrel  (PLAVIX ) 75 MG tablet   Has the patient contacted their pharmacy? Yes (Agent: If no, request that the patient contact the pharmacy for the refill. If patient does not wish to contact the pharmacy document the reason why and proceed with request.) (Agent: If yes, when and what did the pharmacy advise?) Pharmacy stated that they need new prescription and that they have been trying to contact office for refills. This is the patient's preferred pharmacy:  [id number], [place] - [address] AT [facility] OF [address] [place] [address] Phone: [phone] Fax: [phone]  Is this the correct pharmacy for this prescription? Yes If no, delete pharmacy and type the correct one.   Has the prescription been filled recently? No  Is the patient out of the medication? Yes  Has the patient been seen for an appointment in the last year OR does the patient have an upcoming appointment? Yes  Can we respond through MyChart? No  Agent: Please be advised that Rx refills may take up to 3 business days. We ask that you follow-up with your pharmacy.

## 2023-09-12 MED ORDER — LISINOPRIL 10 MG PO TABS: 10.0000 mg | ORAL_TABLET | Freq: Every day | ORAL | 1 refills | Status: DC

## 2023-09-21 ENCOUNTER — Other Ambulatory Visit (INDEPENDENT_AMBULATORY_CARE_PROVIDER_SITE_OTHER): Payer: No Typology Code available for payment source

## 2023-09-21 DIAGNOSIS — R7303 Prediabetes: Secondary | ICD-10-CM

## 2023-09-21 DIAGNOSIS — I10 Essential (primary) hypertension: Secondary | ICD-10-CM

## 2023-09-21 LAB — COMPREHENSIVE METABOLIC PANEL WITH GFR
ALT: 19 U/L (ref 0–35)
AST: 20 U/L (ref 0–37)
Albumin: 4 g/dL (ref 3.5–5.2)
Alkaline Phosphatase: 74 U/L (ref 39–117)
BUN: 23 mg/dL (ref 6–23)
CO2: 25 meq/L (ref 19–32)
Calcium: 9.6 mg/dL (ref 8.4–10.5)
Chloride: 104 meq/L (ref 96–112)
Creatinine, Ser: 1.03 mg/dL (ref 0.40–1.20)
GFR: 49.69 mL/min — ABNORMAL LOW (ref >60.00)
Glucose, Bld: 97 mg/dL (ref 70–99)
Potassium: 4 meq/L (ref 3.5–5.1)
Sodium: 140 meq/L (ref 135–145)
Total Bilirubin: 0.6 mg/dL (ref 0.2–1.2)
Total Protein: 6.8 g/dL (ref 6.0–8.3)

## 2023-09-21 LAB — LIPID PANEL
Cholesterol: 166 mg/dL (ref 0–200)
HDL: 54.1 mg/dL (ref >39.00)
LDL Cholesterol: 88 mg/dL (ref 0–99)
NonHDL: 111.45
Total CHOL/HDL Ratio: 3
Triglycerides: 119 mg/dL (ref 0.0–149.0)
VLDL: 23.8 mg/dL (ref 0.0–40.0)

## 2023-09-21 LAB — VITAMIN B12: Vitamin B-12: 350 pg/mL (ref 211–911)

## 2023-09-21 LAB — VITAMIN D 25 HYDROXY (VIT D DEFICIENCY, FRACTURES): VITD: 27.44 ng/mL — ABNORMAL LOW (ref 30.00–100.00)

## 2023-09-23 ENCOUNTER — Telehealth: Payer: Self-pay | Admitting: Nurse Practitioner

## 2023-09-23 ENCOUNTER — Ambulatory Visit: Payer: No Typology Code available for payment source | Admitting: Nurse Practitioner

## 2023-09-23 NOTE — Telephone Encounter (Signed)
 LMOM for pt to call back and reschedule today's 3 mo follow-up visit.   E2C2, please rewschedule with pt when they call back. [facility]

## 2023-09-29 ENCOUNTER — Ambulatory Visit
Admission: RE | Admit: 2023-09-29 | Discharge: 2023-09-29 | Disposition: A | Discharge status: Home / Self Care | Source: Ambulatory Visit | Attending: Neurology | Admitting: Neurology

## 2023-09-29 ENCOUNTER — Other Ambulatory Visit: Payer: Self-pay | Admitting: Neurology

## 2023-09-29 DIAGNOSIS — Z1331 Encounter for screening for depression: Secondary | ICD-10-CM | POA: Diagnosis not present

## 2023-09-29 DIAGNOSIS — F0150 Vascular dementia without behavioral disturbance: Secondary | ICD-10-CM | POA: Diagnosis not present

## 2023-09-29 DIAGNOSIS — M7989 Other specified soft tissue disorders: Secondary | ICD-10-CM

## 2023-09-29 DIAGNOSIS — I639 Cerebral infarction, unspecified: Secondary | ICD-10-CM | POA: Diagnosis not present

## 2023-09-29 DIAGNOSIS — Z8673 Personal history of transient ischemic attack (TIA), and cerebral infarction without residual deficits: Secondary | ICD-10-CM | POA: Diagnosis not present

## 2023-09-29 DIAGNOSIS — H9193 Unspecified hearing loss, bilateral: Secondary | ICD-10-CM | POA: Diagnosis not present

## 2023-09-29 DIAGNOSIS — I679 Cerebrovascular disease, unspecified: Secondary | ICD-10-CM | POA: Diagnosis not present

## 2023-09-30 ENCOUNTER — Encounter: Payer: Self-pay | Admitting: Nurse Practitioner

## 2023-09-30 ENCOUNTER — Ambulatory Visit: Admitting: Nurse Practitioner

## 2023-09-30 VITALS — BP 124/82 | HR 78 | Temp 98.4°F | Ht 59.0 in | Wt 105.4 lb

## 2023-09-30 DIAGNOSIS — N1831 Chronic kidney disease, stage 3a: Secondary | ICD-10-CM

## 2023-09-30 DIAGNOSIS — L989 Disorder of the skin and subcutaneous tissue, unspecified: Secondary | ICD-10-CM | POA: Diagnosis not present

## 2023-09-30 DIAGNOSIS — I639 Cerebral infarction, unspecified: Secondary | ICD-10-CM

## 2023-09-30 DIAGNOSIS — M79604 Pain in right leg: Secondary | ICD-10-CM

## 2023-09-30 DIAGNOSIS — I700 Atherosclerosis of aorta: Secondary | ICD-10-CM

## 2023-09-30 DIAGNOSIS — E782 Mixed hyperlipidemia: Secondary | ICD-10-CM

## 2023-09-30 DIAGNOSIS — I6521 Occlusion and stenosis of right carotid artery: Secondary | ICD-10-CM

## 2023-09-30 DIAGNOSIS — I10 Essential (primary) hypertension: Secondary | ICD-10-CM | POA: Diagnosis not present

## 2023-09-30 DIAGNOSIS — E559 Vitamin D deficiency, unspecified: Secondary | ICD-10-CM | POA: Diagnosis not present

## 2023-09-30 MED ORDER — ATORVASTATIN CALCIUM 80 MG PO TABS
80.0000 mg | ORAL_TABLET | Freq: Every day | ORAL | 2 refills | Status: DC
Start: 2023-09-30 — End: 2024-03-17

## 2023-09-30 MED ORDER — AMLODIPINE BESYLATE 10 MG PO TABS: 10.0000 mg | ORAL_TABLET | Freq: Every day | ORAL | 2 refills | Status: AC

## 2023-10-01 LAB — MAGNESIUM: Magnesium: 2.3 mg/dL (ref 1.5–2.5)

## 2023-10-13 DIAGNOSIS — H9313 Tinnitus, bilateral: Secondary | ICD-10-CM | POA: Diagnosis not present

## 2023-10-13 DIAGNOSIS — H903 Sensorineural hearing loss, bilateral: Secondary | ICD-10-CM | POA: Diagnosis not present

## 2023-10-31 DIAGNOSIS — M79604 Pain in right leg: Secondary | ICD-10-CM | POA: Insufficient documentation

## 2023-10-31 NOTE — Assessment & Plan Note (Signed)
Will check magnesium level

## 2023-10-31 NOTE — Assessment & Plan Note (Signed)
 Vitamin D  low 27.44. -Advised patient to take over-the-counter vitamin D3 supplement 2000 IU daily. -Will continue to monitor.

## 2023-10-31 NOTE — Assessment & Plan Note (Signed)
 Lab Results  Component Value Date   CHOL 166 09/21/2023   HDL 54.10 09/21/2023   LDLCALC 88 09/21/2023   TRIG 119.0 09/21/2023   CHOLHDL 3 09/21/2023  - Continue statin therapy for cardiovascular risk reduction. -Will continue to monitor

## 2023-10-31 NOTE — Assessment & Plan Note (Signed)
 Skin lesion on the posterior R thigh when described above. -Send refer to dermatology for further evaluation and management.

## 2023-10-31 NOTE — Progress Notes (Signed)
 Established Patient Office Visit  Subjective:  Patient ID: Alexa Ingram, female    DOB: [birth year]  Age: 83 y.o. MRN: [id number]  CC:  Chief Complaint  Patient presents with   Medical Management of Chronic Issues    HPI  Alexa Ingram presents for chronic disease follow up. She has history of hypertension, hyperlipidemia, CKD and new lower extremity weakness with walking. The venous ultrasound negative for DVT.   She has a rash on the back of her thigh that some times itches the rash there for more than  6 months sometimes gets better but comes back again. She has used Bactroban  ointment with some improvement. She would like to see dermatology for it.   Blood work reviewed.    HPI   Past Medical History:  Diagnosis Date   Chicken pox    GERD (gastroesophageal reflux disease)    Hypertension    Osteopenia    Pure hypercholesterolemia    Stroke (HCC)    Urinary tract infection     Past Surgical History:  Procedure Laterality Date   ABDOMINAL HYSTERECTOMY  1976   ovaries remain   BREAST BIOPSY Right 1986   neg    Family History  Problem Relation Age of Onset   Osteoarthritis Mother    Stroke Mother    Hypertension Mother    Breast cancer Sister 65   Stroke Sister    Healthy Daughter    Healthy Son    Breast cancer Paternal Aunt 22    Social History   Socioeconomic History   Marital status: Single    Spouse name: Not on file   Number of children: Not on file   Years of education: Not on file   Highest education level: High school graduate  Occupational History   Not on file  Tobacco Use   Smoking status: Never   Smokeless tobacco: Never  Vaping Use   Vaping status: Never Used  Substance and Sexual Activity   Alcohol use: Not Currently   Drug use: Never   Sexual activity: Yes  Other Topics Concern   Not on file  Social History Narrative   ** Merged History Encounter **       Social Drivers of Health   Financial Resource  Strain: Low Risk  (05/19/2023)   Received from [facility]   Overall Financial Resource Strain (CARDIA)    Difficulty of Paying Living Expenses: Not hard at all  Food Insecurity: No Food Insecurity (05/19/2023)   Received from [facility]   Hunger Vital Sign    Within the past 12 months, you worried that your food would run out before you got the money to buy more.: Never true    Within the past 12 months, the food you bought just didn't last and you didn't have money to get more.: Never true  Transportation Needs: No Transportation Needs (05/19/2023)   Received from [facility] - Transportation    In the past 12 months, has lack of transportation kept you from medical appointments or from getting medications?: No    Lack of Transportation (Non-Medical): No  Physical Activity: Not on file  Stress: Not on file  Social Connections: Socially Isolated (05/04/2023)   Social Connection and Isolation Panel    Frequency of Communication with Friends and Family: Three times a week    Frequency of Social Gatherings with Friends and Family: Three times a week  Attends Religious Services: Never    Active Member of Clubs or Organizations: No    Attends Banker Meetings: Never    Marital Status: Divorced  Catering manager Violence: Not At Risk (05/04/2023)   Humiliation, Afraid, Rape, and Kick questionnaire    Fear of Current or Ex-Partner: No    Emotionally Abused: No    Physically Abused: No    Sexually Abused: No     Outpatient Medications Prior to Visit  Medication Sig Dispense Refill   acetaminophen  (TYLENOL ) 325 MG tablet Take 2 tablets (650 mg total) by mouth 3 (three) times daily as needed for mild pain (pain score 1-3), moderate pain (pain score 4-6), fever or headache (or temp > 37.5 C (99.5 F)).     aspirin  EC 81 MG tablet Take 1 tablet (81 mg total) by mouth daily. Swallow whole. 90 tablet 1   lisinopril   (ZESTRIL ) 10 MG tablet Take 1 tablet (10 mg total) by mouth daily. Skip the dose if systolic BP less than 140 mmHg 90 tablet 1   mupirocin  ointment (BACTROBAN ) 2 % Apply 1 Application topically 2 (two) times daily. 22 g 0   amLODipine  (NORVASC ) 10 MG tablet Take 1 tablet (10 mg total) by mouth daily. Skip the dose if systolic BP less than 140 mmHg 90 tablet 1   atorvastatin  (LIPITOR) 80 MG tablet Take 1 tablet (80 mg total) by mouth at bedtime. 90 tablet 1   No facility-administered medications prior to visit.    Allergies  Allergen Reactions   Sulfa Antibiotics Itching   Sulfate    Penicillin G Rash    ROS Review of Systems Negative unless indicated in HPI.    Objective:    Physical Exam Constitutional:      Appearance: Normal appearance.  HENT:     Mouth/Throat:     Mouth: Mucous membranes are moist.   Eyes:     Conjunctiva/sclera: Conjunctivae normal.     Pupils: Pupils are equal, round, and reactive to light.    Cardiovascular:     Rate and Rhythm: Normal rate and regular rhythm.     Pulses: Normal pulses.     Heart sounds: Normal heart sounds.  Pulmonary:     Effort: Pulmonary effort is normal.     Breath sounds: Normal breath sounds.  Abdominal:     General: Bowel sounds are normal.     Palpations: Abdomen is soft.   Musculoskeletal:     Cervical back: Normal range of motion. No tenderness.   Skin:    Findings: Lesion (Dime shaped skin lesion on the right posterior thigh) present. No bruising.   Neurological:     General: No focal deficit present.     Mental Status: She is alert and oriented to person, place, and time. Mental status is at baseline.   Psychiatric:        Mood and Affect: Mood normal.        Behavior: Behavior normal.        Thought Content: Thought content normal.        Judgment: Judgment normal.     BP 124/82   Pulse 78   Temp 98.4 F (36.9 C)   Ht 4' 11 (1.499 m)   Wt 105 lb 6.4 oz (47.8 kg)   SpO2 97%   BMI 21.29 kg/m   Wt Readings from Last 3 Encounters:  09/30/23 105 lb 6.4 oz (47.8 kg)  07/29/23 105 lb 9.6 oz (47.9 kg)  06/22/23  111 lb 12.8 oz (50.7 kg)     Health Maintenance  Topic Date Due   Medicare Annual Wellness (AWV)  Never done   DTaP/Tdap/Td (1 - Tdap) Never done   Zoster Vaccines- Shingrix (1 of 2) Never done   DEXA SCAN  Never done   COVID-19 Vaccine (1 - 2024-25 season) Never done   Pneumococcal Vaccine: 50+ Years (1 of 2 - PCV) 11/10/2023 (Originally 07/10/1959)   Hepatitis B Vaccines  Aged Out   HPV VACCINES  Aged Out   Meningococcal B Vaccine  Aged Out   INFLUENZA VACCINE  Discontinued    There are no preventive care reminders to display for this patient.  Lab Results  Component Value Date   TSH 1.466 05/05/2023   Lab Results  Component Value Date   WBC 9.3 05/10/2023   HGB 15.3 (H) 05/10/2023   HCT 45.7 05/10/2023   MCV 92.1 05/10/2023   PLT 275.0 05/10/2023   Lab Results  Component Value Date   NA 140 09/21/2023   K 4.0 09/21/2023   CO2 25 09/21/2023   GLUCOSE 97 09/21/2023   BUN 23 09/21/2023   CREATININE 1.03 09/21/2023   BILITOT 0.6 09/21/2023   ALKPHOS 74 09/21/2023   AST 20 09/21/2023   ALT 19 09/21/2023   PROT 6.8 09/21/2023   ALBUMIN 4.0 09/21/2023   CALCIUM  9.6 09/21/2023   ANIONGAP 12 05/03/2023   EGFR 50 (L) 05/10/2023   GFR 49.69 (L) 09/21/2023   Lab Results  Component Value Date   CHOL 166 09/21/2023   Lab Results  Component Value Date   HDL 54.10 09/21/2023   Lab Results  Component Value Date   LDLCALC 88 09/21/2023   Lab Results  Component Value Date   TRIG 119.0 09/21/2023   Lab Results  Component Value Date   CHOLHDL 3 09/21/2023   Lab Results  Component Value Date   HGBA1C 5.8 (H) 05/04/2023      Assessment & Plan:  Vitamin D  deficiency Assessment & Plan: Vitamin D  low 27.44. -Advised patient to take over-the-counter vitamin D3 supplement 2000 IU daily. -Will continue to monitor.   CKD stage 3a, GFR 45-59  ml/min (HCC)  Primary hypertension Assessment & Plan: Patient BP  Vitals:   09/30/23 1438  BP: 124/82    in the office.  -Advised pt to follow a low sodium and heart healthy diet. - Continue current medication regimen.   Orders: -     amLODIPine  Besylate; Take 1 tablet (10 mg total) by mouth daily. Skip the dose if systolic BP less than 140 mmHg  Dispense: 90 tablet; Refill: 2  Mixed hyperlipidemia Assessment & Plan: Lab Results  Component Value Date   CHOL 166 09/21/2023   HDL 54.10 09/21/2023   LDLCALC 88 09/21/2023   TRIG 119.0 09/21/2023   CHOLHDL 3 09/21/2023  - Continue statin therapy for cardiovascular risk reduction. -Will continue to monitor  Orders: -     Atorvastatin  Calcium ; Take 1 tablet (80 mg total) by mouth at bedtime.  Dispense: 90 tablet; Refill: 2  Pain of right lower extremity Assessment & Plan: Will check magnesium level.  Orders: -     Magnesium  Skin lesion Assessment & Plan: Skin lesion on the posterior R thigh when described above. -Send refer to dermatology for further evaluation and management.      Follow-up: Return in about 4 months (around 01/31/2024) for chronic management.   Gerrod Maule, NP

## 2023-10-31 NOTE — Assessment & Plan Note (Signed)
 Patient BP  Vitals:   09/30/23 1438  BP: 124/82    in the office.  -Advised pt to follow a low sodium and heart healthy diet. - Continue current medication regimen.

## 2023-11-16 ENCOUNTER — Ambulatory Visit: Admitting: Dermatology

## 2023-12-01 ENCOUNTER — Ambulatory Visit: Admitting: *Deleted

## 2023-12-01 VITALS — Ht 59.0 in | Wt 115.0 lb

## 2023-12-01 DIAGNOSIS — Z0000 Encounter for general adult medical examination without abnormal findings: Secondary | ICD-10-CM | POA: Diagnosis not present

## 2023-12-01 NOTE — Progress Notes (Signed)
 Subjective:   Alexa Ingram is a 83 y.o. who presents for a Medicare Wellness preventive visit.  As a reminder, Annual Wellness Visits don't include a physical exam, and some assessments may be limited, especially if this visit is performed virtually. We may recommend an in-person follow-up visit with your provider if needed.  Visit Complete: Virtual I connected with  Alexa Ingram on 12/01/23 by a audio enabled telemedicine application and verified that I am speaking with the correct person using two identifiers.  Patient Location: Home  Provider Location: Home Office  I discussed the limitations of evaluation and management by telemedicine. The patient expressed understanding and agreed to proceed.  Vital Signs: Because this visit was a virtual/telehealth visit, some criteria may be missing or patient reported. Any vitals not documented were not able to be obtained and vitals that have been documented are patient reported.  VideoDeclined- This patient declined Librarian, academic. Therefore the visit was completed with audio only.  Persons Participating in Visit: Patient.  assisted by grandson Franky  AWV Questionnaire: No: Patient Medicare AWV questionnaire was not completed prior to this visit.  Cardiac Risk Factors include: advanced age (>46men, >66 women);dyslipidemia;hypertension;sedentary lifestyle     Objective:    Today's Vitals   12/01/23 1425  Weight: 115 lb (52.2 kg)  Height: 4' 11 (1.499 m)   Body mass index is 23.23 kg/m.     12/01/2023    2:46 PM 05/03/2023    4:25 PM 05/03/2023    4:20 PM 03/18/2019    3:46 PM 05/21/2018    8:59 AM  Advanced Directives  Does Patient Have a Medical Advance Directive? No No Yes No No   Type of Advance Directive   Healthcare Power of Attorney    Does patient want to make changes to medical advance directive?   No - Patient declined    Copy of Healthcare Power of Attorney in Chart?    No - copy requested    Would patient like information on creating a medical advance directive? No - Patient declined    No - Patient declined      Data saved with a previous flowsheet row definition    Current Medications (verified) Outpatient Encounter Medications as of 12/01/2023  Medication Sig   acetaminophen  (TYLENOL ) 325 MG tablet Take 2 tablets (650 mg total) by mouth 3 (three) times daily as needed for mild pain (pain score 1-3), moderate pain (pain score 4-6), fever or headache (or temp > 37.5 C (99.5 F)).   amLODipine  (NORVASC ) 10 MG tablet Take 1 tablet (10 mg total) by mouth daily. Skip the dose if systolic BP less than 140 mmHg   aspirin  EC 81 MG tablet Take 1 tablet (81 mg total) by mouth daily. Swallow whole.   atorvastatin  (LIPITOR) 80 MG tablet Take 1 tablet (80 mg total) by mouth at bedtime.   lisinopril  (ZESTRIL ) 10 MG tablet Take 1 tablet (10 mg total) by mouth daily. Skip the dose if systolic BP less than 140 mmHg   mupirocin  ointment (BACTROBAN ) 2 % Apply 1 Application topically 2 (two) times daily. (Patient not taking: Reported on 12/01/2023)   [DISCONTINUED] potassium chloride  SA (K-DUR,KLOR-CON ) 20 MEQ tablet Take 1 tablet (20 mEq total) by mouth daily.   No facility-administered encounter medications on file as of 12/01/2023.    Allergies (verified) Sulfa antibiotics, Sulfate, and Penicillin g   History: Past Medical History:  Diagnosis Date   Chicken pox  GERD (gastroesophageal reflux disease)    Hypertension    Osteopenia    Pure hypercholesterolemia    Stroke [facility])    Urinary tract infection    Past Surgical History:  Procedure Laterality Date   ABDOMINAL HYSTERECTOMY  1976   ovaries remain   BREAST BIOPSY Right 1986   neg   Family History  Problem Relation Age of Onset   Osteoarthritis Mother    Stroke Mother    Hypertension Mother    Breast cancer Sister 3   Stroke Sister    Healthy Daughter    Healthy Son    Breast cancer Paternal  Aunt 55   Social History   Socioeconomic History   Marital status: Single    Spouse name: Not on file   Number of children: Not on file   Years of education: Not on file   Highest education level: High school graduate  Occupational History   Not on file  Tobacco Use   Smoking status: Never   Smokeless tobacco: Never  Vaping Use   Vaping status: Never Used  Substance and Sexual Activity   Alcohol use: Not Currently   Drug use: Never   Sexual activity: Yes  Other Topics Concern   Not on file  Social History Narrative   ** Merged History Encounter **       Social Drivers of Health   Financial Resource Strain: Low Risk  (12/01/2023)   Overall Financial Resource Strain (CARDIA)    Difficulty of Paying Living Expenses: Not hard at all  Food Insecurity: No Food Insecurity (12/01/2023)   Hunger Vital Sign    Worried About Running Out of Food in the Last Year: Never true    Ran Out of Food in the Last Year: Never true  Transportation Needs: No Transportation Needs (12/01/2023)   PRAPARE - Administrator, Civil Service (Medical): No    Lack of Transportation (Non-Medical): No  Physical Activity: Inactive (12/01/2023)   Exercise Vital Sign    Days of Exercise per Week: 0 days    Minutes of Exercise per Session: 0 min  Stress: Stress Concern Present (12/01/2023)   [employer] of Occupational Health - Occupational Stress Questionnaire    Feeling of Stress: To some extent  Social Connections: Socially Isolated (12/01/2023)   Social Connection and Isolation Panel    Frequency of Communication with Friends and Family: More than three times a week    Frequency of Social Gatherings with Friends and Family: More than three times a week    Attends Religious Services: Never    Database administrator or Organizations: No    Attends Engineer, structural: Never    Marital Status: Divorced    Tobacco Counseling Counseling given: Not Answered    Clinical  Intake:  Pre-visit preparation completed: Yes  Pain : No/denies pain     BMI - recorded: 23.23 Nutritional Status: BMI of 19-24  Normal Nutritional Risks: Non-healing wound (being treated) Diabetes: No  Lab Results  Component Value Date   HGBA1C 5.8 (H) 05/04/2023     How often do you need to have someone help you when you read instructions, pamphlets, or other written materials from your doctor or pharmacy?: 1 - Never  Interpreter Needed?: No  Information entered by :: R. Emmaly Leech LPN   Activities of Daily Living     12/01/2023    2:32 PM 05/04/2023    8:02 PM  In your present state of health,  do you have any difficulty performing the following activities:  Hearing? 1 0  Vision? 0 0  Difficulty concentrating or making decisions? 1 0  Walking or climbing stairs? 1   Dressing or bathing? 0   Doing errands, shopping? 1 0  Preparing Food and eating ? N   Using the Toilet? N   In the past six months, have you accidently leaked urine? N   Do you have problems with loss of bowel control? N   Managing your Medications? N   Managing your Finances? N   Housekeeping or managing your Housekeeping? Y     Patient Care Team: Vincente Saber, NP as PCP - General (Nurse Practitioner) Joshua Vito ORN, CNM (Inactive) as Consulting Physician (Obstetrics and Gynecology) Custovic, Sabina, DO as Referring Physician (Cardiology) Maree Jannett POUR, MD as Consulting Physician (Neurology)  I have updated your Care Teams any recent Medical Services you may have received from other providers in the past year.     Assessment:   This is a routine wellness examination for [facility]  Hearing/Vision screen Hearing Screening - Comments:: Needs aids but will not get Vision Screening - Comments:: glasses   Goals Addressed             This Visit's Progress    Patient Stated       Wants to try to move into an assisted living       Depression Screen     12/01/2023    2:39 PM 09/30/2023     2:40 PM 06/10/2023    1:20 PM  PHQ 2/9 Scores  PHQ - 2 Score 1 4 1   PHQ- 9 Score 5 11 9     Fall Risk     12/01/2023    2:36 PM 09/30/2023    2:40 PM 06/10/2023    1:20 PM  Fall Risk   Falls in the past year? 0 0 0  Number falls in past yr: 0 0 0  Injury with Fall? 0 0 0  Risk for fall due to : No Fall Risks No Fall Risks No Fall Risks  Follow up Falls evaluation completed;Falls prevention discussed Falls evaluation completed Falls evaluation completed    MEDICARE RISK AT HOME:  Medicare Risk at Home Any stairs in or around the home?: Yes If so, are there any without handrails?: Yes Home free of loose throw rugs in walkways, pet beds, electrical cords, etc?: No Adequate lighting in your home to reduce risk of falls?: Yes Life alert?: No Use of a cane, walker or w/c?: No Grab bars in the bathroom?: No Shower chair or bench in shower?: No Elevated toilet seat or a handicapped toilet?: No  TIMED UP AND GO:  Was the test performed?  No  Cognitive Function: 6CIT completed        12/01/2023    2:47 PM  6CIT Screen  What Year? 0 points  What month? 0 points  What time? 0 points  Count back from 20 0 points  Months in reverse 0 points  Repeat phrase 2 points  Total Score 2 points    Immunizations  There is no immunization history on file for this patient.  Screening Tests Health Maintenance  Topic Date Due   Medicare Annual Wellness (AWV)  Never done   DTaP/Tdap/Td (1 - Tdap) Never done   Pneumococcal Vaccine: 50+ Years (1 of 2 - PCV) Never done   Zoster Vaccines- Shingrix (1 of 2) Never done   DEXA SCAN  Never  done   COVID-19 Vaccine (1 - 2024-25 season) Never done   Hepatitis B Vaccines  Aged Out   HPV VACCINES  Aged Out   Meningococcal B Vaccine  Aged Out   INFLUENZA VACCINE  Discontinued    Health Maintenance  Health Maintenance Due  Topic Date Due   Medicare Annual Wellness (AWV)  Never done   DTaP/Tdap/Td (1 - Tdap) Never done   Pneumococcal Vaccine:  50+ Years (1 of 2 - PCV) Never done   Zoster Vaccines- Shingrix (1 of 2) Never done   DEXA SCAN  Never done   COVID-19 Vaccine (1 - 2024-25 season) Never done   Health Maintenance Items Addressed: Patient declines vaccines and dexa scan.   Additional Screening:  Vision Screening: Recommended annual ophthalmology exams for early detection of glaucoma and other disorders of the eye. Up to date  [facility] Would you like a referral to an eye doctor? No    Dental Screening: Recommended annual dental exams for proper oral hygiene  Community Resource Referral / Chronic Care Management: CRR required this visit?  No   CCM required this visit?  No   Plan:    I have personally reviewed and noted the following in the patient's chart:   Medical and social history Use of alcohol, tobacco or illicit drugs  Current medications and supplements including opioid prescriptions. Patient is not currently taking opioid prescriptions. Functional ability and status Nutritional status Physical activity Advanced directives List of other physicians Hospitalizations, surgeries, and ER visits in previous 12 months Vitals Screenings to include cognitive, depression, and falls Referrals and appointments  In addition, I have reviewed and discussed with patient certain preventive protocols, quality metrics, and best practice recommendations. A written personalized care plan for preventive services as well as general preventive health recommendations were provided to patient.   Angeline Fredericks, LPN   [id number]/[id number]   After Visit Summary: (MyChart) Due to this being a telephonic visit, the after visit summary with patients personalized plan was offered to patient via MyChart   Notes: Nothing significant to report at this time.

## 2023-12-01 NOTE — Patient Instructions (Signed)
 Alexa Ingram , Thank you for taking time out of your busy schedule to complete your Annual Wellness Visit with me. I enjoyed our conversation and look forward to speaking with you again next year. I, as well as your care team,  appreciate your ongoing commitment to your health goals. Please review the following plan we discussed and let me know if I can assist you in the future. Your Game plan/ To Do List    Referrals: If you haven't heard from the office you've been referred to, please reach out to them at the phone provided.  Consider updating your vaccines and getting a Dexa/Bone density scan. Follow up Visits: Next Medicare AWV with our clinical staff: 12/05/24 @ 3:00   Have you seen your provider in the last 6 months (3 months if uncontrolled diabetes)? Yes Next Office Visit with your provider: 02/04/24  Clinician Recommendations:  Aim for 30 minutes of exercise or brisk walking, 6-8 glasses of water, and 5 servings of fruits and vegetables each day.       This is a list of the screening recommended for you and due dates:  Health Maintenance  Topic Date Due   DTaP/Tdap/Td vaccine (1 - Tdap) Never done   Pneumococcal Vaccine for age over 86 (1 of 2 - PCV) Never done   Zoster (Shingles) Vaccine (1 of 2) Never done   DEXA scan (bone density measurement)  Never done   COVID-19 Vaccine (1 - 2024-25 season) Never done   Medicare Annual Wellness Visit  11/30/2024   Hepatitis B Vaccine  Aged Out   HPV Vaccine  Aged Out   Meningitis B Vaccine  Aged Out   Flu Shot  Discontinued    Advanced directives: (ACP Link)Information on Advanced Care Planning can be found at [place]  Best boy Advance Health Care Directives Advance Health Care Directives. [email or web address]  Advance Care Planning is important because it:  [x]  Makes sure you receive the medical care that is consistent with your values, goals, and preferences  [x]  It provides guidance to your family and loved ones and reduces their  decisional burden about whether or not they are making the right decisions based on your wishes.  Follow the link provided in your after visit summary or read over the paperwork we have mailed to you to help you started getting your Advance Directives in place. If you need assistance in completing these, please reach out to us  so that we can help you!

## 2023-12-06 DIAGNOSIS — D485 Neoplasm of uncertain behavior of skin: Secondary | ICD-10-CM | POA: Diagnosis not present

## 2023-12-06 DIAGNOSIS — C44722 Squamous cell carcinoma of skin of right lower limb, including hip: Secondary | ICD-10-CM | POA: Diagnosis not present

## 2023-12-06 DIAGNOSIS — L821 Other seborrheic keratosis: Secondary | ICD-10-CM | POA: Diagnosis not present

## 2023-12-24 ENCOUNTER — Telehealth: Payer: Self-pay

## 2023-12-24 NOTE — Telephone Encounter (Signed)
 Copied from CRM [id number]. Topic: Clinical - Medical Advice >> Dec 24, 2023 11:41 AM Melissa C wrote: Reason for CRM: patient's grandson called on behalf of patient- he inquired about several forms for patient to see if he could pick them up from the office and have her complete her part but will also be turning them back in to have physician complete their part. I informed him of documentation turnaround time in advance. He also inquired about driving. He didn't know if there would be a form but if there isn't he would need a letter from the doctor to show that she is not capable of driving anymore.

## 2023-12-28 NOTE — Telephone Encounter (Signed)
 No forms yet. I think I will have to call the grandson on 12/29/23 to find out what they need and where the forms are.

## 2023-12-28 NOTE — Telephone Encounter (Signed)
 Have we received any forms yet?

## 2023-12-29 NOTE — Telephone Encounter (Signed)
 Spoke to Patient's [place] Drivers who states the Patient has skin cancer on her side where the lesion was and the doctor will remove that on 12/30/23. Drivers would like to know if the Patient needs a follow up before 02/04/24 when she is scheduled? Drivers picked up a POA and some other forms to fill out. Drivers states he does not think we need to fill any thing on these forms out but I did let him know that we do need to scan the forms into her chart. Drivers states he needs a letter for the Patient not to drive due to her medical conditions.

## 2023-12-29 NOTE — Telephone Encounter (Signed)
 Sounds good

## 2023-12-30 DIAGNOSIS — C44722 Squamous cell carcinoma of skin of right lower limb, including hip: Secondary | ICD-10-CM | POA: Diagnosis not present

## 2023-12-30 DIAGNOSIS — D0471 Carcinoma in situ of skin of right lower limb, including hip: Secondary | ICD-10-CM | POA: Diagnosis not present

## 2024-01-27 ENCOUNTER — Other Ambulatory Visit (INDEPENDENT_AMBULATORY_CARE_PROVIDER_SITE_OTHER): Payer: Self-pay | Admitting: Vascular Surgery

## 2024-01-27 DIAGNOSIS — I701 Atherosclerosis of renal artery: Secondary | ICD-10-CM

## 2024-01-27 DIAGNOSIS — I6523 Occlusion and stenosis of bilateral carotid arteries: Secondary | ICD-10-CM

## 2024-01-30 NOTE — Progress Notes (Signed)
 MRN : [id number]  Alexa Ingram is a 83 y.o. (August 03, 1940) female who presents with chief complaint of check circulation.  History of Present Illness:   The patient is seen for follow up evaluation of carotid stenosis. The carotid stenosis followed by ultrasound.    The patient denies amaurosis fugax. There is no recent history of TIA symptoms or focal motor deficits. There is no prior documented CVA.   The patient is taking enteric-coated aspirin  81 mg daily.   There is no history of migraine headaches. There is no history of seizures.   No recent shortening of the patient's walking distance or new symptoms consistent with claudication.  No history of rest pain symptoms. No new ulcers or wounds of the lower extremities have occurred.   There is no history of DVT, PE or superficial thrombophlebitis. No documented recent episodes of angina or shortness of breath documented.    Carotid Duplex done today shows RICA 1-39% and LICA 40-59%.  Vertebral arteries demonstrate bilateral antegrade flow.  No outpatient medications have been marked as taking for the 01/31/24 encounter (Appointment) with Jama, Cordella MATSU, MD.    Past Medical History:  Diagnosis Date   Chicken pox    GERD (gastroesophageal reflux disease)    Hypertension    Osteopenia    Pure hypercholesterolemia    Stroke [facility])    Urinary tract infection     Past Surgical History:  Procedure Laterality Date   ABDOMINAL HYSTERECTOMY  1976   ovaries remain   BREAST BIOPSY Right 1986   neg    Social History Social History   Tobacco Use   Smoking status: Never   Smokeless tobacco: Never  Vaping Use   Vaping status: Never Used  Substance Use Topics   Alcohol use: Not Currently   Drug use: Never    Family History Family History  Problem Relation Age of Onset   Osteoarthritis Mother    Stroke Mother    Hypertension Mother     Breast cancer Sister 3   Stroke Sister    Healthy Daughter    Healthy Son    Breast cancer Paternal Aunt 47    Allergies  Allergen Reactions   Sulfa Antibiotics Itching   Sulfate    Penicillin G Rash     REVIEW OF SYSTEMS (Negative unless checked)  Constitutional: [] Weight loss  [] Fever  [] Chills Cardiac: [] Chest pain   [] Chest pressure   [] Palpitations   [] Shortness of breath when laying flat   [] Shortness of breath with exertion. Vascular:  [x] Pain in legs with walking   [] Pain in legs at rest  [] History of DVT   [] Phlebitis   [] Swelling in legs   [] Varicose veins   [] Non-healing ulcers Pulmonary:   [] Uses home oxygen   [] Productive cough   [] Hemoptysis   [] Wheeze  [] COPD   [] Asthma Neurologic:  [] Dizziness   [] Seizures   [] History of stroke   [] History of TIA  [] Aphasia   [] Vissual changes   [] Weakness or numbness in arm   [] Weakness or numbness in leg Musculoskeletal:   [] Joint swelling   []   Joint pain   [] Low back pain Hematologic:  [] Easy bruising  [] Easy bleeding   [] Hypercoagulable state   [] Anemic Gastrointestinal:  [] Diarrhea   [] Vomiting  [] Gastroesophageal reflux/heartburn   [] Difficulty swallowing. Genitourinary:  [] Chronic kidney disease   [] Difficult urination  [] Frequent urination   [] Blood in urine Skin:  [] Rashes   [] Ulcers  Psychological:  [] History of anxiety   []  History of major depression.  Physical Examination  There were no vitals filed for this visit. There is no height or weight on file to calculate BMI. Gen: WD/WN, NAD Head: [place]/AT, No temporalis wasting.  Ear/Nose/Throat: Hearing grossly intact, nares w/o erythema or drainage Eyes: PER, EOMI, sclera nonicteric.  Neck: Supple, no masses.  No bruit or JVD.  Pulmonary:  Good air movement, no audible wheezing, no use of accessory muscles.  Cardiac: RRR, normal S1, S2, no Murmurs. Vascular:  mild trophic changes, no open wounds Vessel Right Left  Radial Palpable Palpable  PT Not Palpable Not Palpable   DP Not Palpable Not Palpable  Gastrointestinal: soft, non-distended. No guarding/no peritoneal signs.  Musculoskeletal: M/S 5/5 throughout.  No visible deformity.  Neurologic: CN 2-12 intact. Pain and light touch intact in extremities.  Symmetrical.  Speech is fluent. Motor exam as listed above. Psychiatric: Judgment intact, Mood & affect appropriate for pt's clinical situation. Dermatologic: No rashes or ulcers noted.  No changes consistent with cellulitis.   CBC Lab Results  Component Value Date   WBC 9.3 05/10/2023   HGB 15.3 (H) 05/10/2023   HCT 45.7 05/10/2023   MCV 92.1 05/10/2023   PLT 275.0 05/10/2023    BMET    Component Value Date/Time   NA 140 09/21/2023 1141   NA 144 04/18/2018 1142   K 4.0 09/21/2023 1141   CL 104 09/21/2023 1141   CO2 25 09/21/2023 1141   GLUCOSE 97 09/21/2023 1141   BUN 23 09/21/2023 1141   BUN 15 04/18/2018 1142   CREATININE 1.03 09/21/2023 1141   CREATININE 1.09 (H) 05/10/2023 1501   CALCIUM  9.6 09/21/2023 1141   GFRNONAA 56 (L) 05/03/2023 1622   GFRAA 51 (L) 05/21/2018 0910   CrCl cannot be calculated (Patient's most recent lab result is older than the maximum 21 days allowed.).  COAG No results found for: INR, PROTIME  Radiology No results found.   Assessment/Plan 1. Carotid stenosis, right (Primary) Recommend: At this point I wish to further evaluate the carotid stenosis.   Continue antiplatelet therapy as prescribed Continue management of CAD, HTN and Hyperlipidemia Healthy heart diet,  encouraged exercise at least 4 times per week   Follow up in 6 months with duplex ultrasound and physical exam  - VAS US  CAROTID; Future  2. Renal artery stenosis Recommend:   The patient has evidence of atherosclerotic changes of the renal artery.  At this time the patient's blood pressure is fairly well controlled.   At this point in time we did discuss the possibility of angiography and stent placement however, patient does not  require angiography of the renal artery given the good control of his hypertension.     However, if at any point the patient's BP becomes acutely worse then further evaluation of the renal artery stenosis and possible intervention would be encouraged.   The patient voices understanding of this plan and agrees.   Patient will follow-up here in the office with renal artery duplex ultrasound as ordered. - VAS US  RENAL ARTERY DUPLEX; Future  3. PAD (peripheral artery disease) Recommend:  The patient has evidence of atherosclerosis of the lower extremities with claudication.  The patient does not voice lifestyle limiting changes at this point in time.   Noninvasive studies do not suggest clinically significant change.   No invasive studies, angiography or surgery at this time The patient should continue walking and begin a more formal exercise program.  The patient should continue antiplatelet therapy and aggressive treatment of the lipid abnormalities   No changes in the patient's medications at this time   Continued surveillance is indicated as atherosclerosis is likely to progress with time.     The patient will continue follow up with noninvasive studies as ordered.  - VAS US  ABI WITH/WO TBI; Future  4. Primary hypertension Continue antihypertensive medications as already ordered, these medications have been reviewed and there are no changes at this time.  5. Pure hypercholesterolemia Continue statin as ordered and reviewed, no changes at this time    Cordella Shawl, MD  01/30/2024 4:42 PM

## 2024-01-31 ENCOUNTER — Ambulatory Visit (INDEPENDENT_AMBULATORY_CARE_PROVIDER_SITE_OTHER): Admitting: Vascular Surgery

## 2024-01-31 ENCOUNTER — Ambulatory Visit (INDEPENDENT_AMBULATORY_CARE_PROVIDER_SITE_OTHER)

## 2024-01-31 ENCOUNTER — Encounter (INDEPENDENT_AMBULATORY_CARE_PROVIDER_SITE_OTHER): Payer: Self-pay | Admitting: Vascular Surgery

## 2024-01-31 VITALS — BP 131/79 | HR 71 | Ht 59.0 in | Wt 106.5 lb

## 2024-01-31 DIAGNOSIS — I701 Atherosclerosis of renal artery: Secondary | ICD-10-CM

## 2024-01-31 DIAGNOSIS — I739 Peripheral vascular disease, unspecified: Secondary | ICD-10-CM

## 2024-01-31 DIAGNOSIS — I6521 Occlusion and stenosis of right carotid artery: Secondary | ICD-10-CM

## 2024-01-31 DIAGNOSIS — E7800 Pure hypercholesterolemia, unspecified: Secondary | ICD-10-CM | POA: Diagnosis not present

## 2024-01-31 DIAGNOSIS — I6523 Occlusion and stenosis of bilateral carotid arteries: Secondary | ICD-10-CM | POA: Diagnosis not present

## 2024-01-31 DIAGNOSIS — I10 Essential (primary) hypertension: Secondary | ICD-10-CM

## 2024-02-04 ENCOUNTER — Ambulatory Visit (INDEPENDENT_AMBULATORY_CARE_PROVIDER_SITE_OTHER): Admitting: Nurse Practitioner

## 2024-02-04 ENCOUNTER — Encounter: Payer: Self-pay | Admitting: Nurse Practitioner

## 2024-02-04 VITALS — BP 133/78 | HR 78 | Temp 97.9°F | Ht 59.0 in | Wt 106.2 lb

## 2024-02-04 DIAGNOSIS — E782 Mixed hyperlipidemia: Secondary | ICD-10-CM

## 2024-02-04 DIAGNOSIS — I10 Essential (primary) hypertension: Secondary | ICD-10-CM

## 2024-02-04 DIAGNOSIS — R7303 Prediabetes: Secondary | ICD-10-CM | POA: Diagnosis not present

## 2024-02-04 DIAGNOSIS — N1831 Chronic kidney disease, stage 3a: Secondary | ICD-10-CM | POA: Diagnosis not present

## 2024-02-04 DIAGNOSIS — C44722 Squamous cell carcinoma of skin of right lower limb, including hip: Secondary | ICD-10-CM | POA: Diagnosis not present

## 2024-02-04 DIAGNOSIS — E559 Vitamin D deficiency, unspecified: Secondary | ICD-10-CM | POA: Diagnosis not present

## 2024-02-04 DIAGNOSIS — I6521 Occlusion and stenosis of right carotid artery: Secondary | ICD-10-CM

## 2024-02-04 NOTE — Progress Notes (Signed)
 Established Patient Office Visit  Subjective:  Patient ID: Alexa Ingram, female    DOB: 1940/11/06  Age: 83 y.o. MRN: [id number]  CC:  Chief Complaint  Patient presents with   Medical Management of Chronic Issues   Discussed the use of a AI scribe software for clinical note transcription with the patient, who gave verbal consent to proceed.  HPI  [name] presents for chronic disease  follow up accompanied by her grandson.  She has been vein and vascular on 01/31/24,  Carotid duplex showed RICA 1-39%(mild narrowing) and LICA 40-59% (Moderate narrowing) recommended continue antiplatelet therapy and management of CAD, hypertension and hyperlipidemia. She had squamous cell carcinoma of right lower limb and had surgical  removal in August, 2025.  He is overall doing well no concerns at present.   HPI   Past Medical History:  Diagnosis Date   Chicken pox    GERD (gastroesophageal reflux disease)    Hypertension    Osteopenia    Pure hypercholesterolemia    Stroke (HCC)    Urinary tract infection     Past Surgical History:  Procedure Laterality Date   ABDOMINAL HYSTERECTOMY  1976   ovaries remain   BREAST BIOPSY Right 1986   neg    Family History  Problem Relation Age of Onset   Osteoarthritis Mother    Stroke Mother    Hypertension Mother    Breast cancer Sister 57   Stroke Sister    Healthy Daughter    Healthy Son    Breast cancer Paternal Aunt 72    Social History   Socioeconomic History   Marital status: Single    Spouse name: Not on file   Number of children: Not on file   Years of education: Not on file   Highest education level: High school graduate  Occupational History   Not on file  Tobacco Use   Smoking status: Never   Smokeless tobacco: Never  Vaping Use   Vaping status: Never Used  Substance and Sexual Activity   Alcohol use: Not Currently   Drug use: Never   Sexual activity: Yes  Other Topics Concern   Not on file   Social History Narrative   ** Merged History Encounter **       Social Drivers of Health   Financial Resource Strain: Low Risk  (12/01/2023)   Overall Financial Resource Strain (CARDIA)    Difficulty of Paying Living Expenses: Not hard at all  Food Insecurity: No Food Insecurity (12/01/2023)   Hunger Vital Sign    Worried About Running Out of Food in the Last Year: Never true    Ran Out of Food in the Last Year: Never true  Transportation Needs: No Transportation Needs (12/01/2023)   PRAPARE - Administrator, Civil Service (Medical): No    Lack of Transportation (Non-Medical): No  Physical Activity: Inactive (12/01/2023)   Exercise Vital Sign    Days of Exercise per Week: 0 days    Minutes of Exercise per Session: 0 min  Stress: Stress Concern Present (12/01/2023)   [employer] of Occupational Health - Occupational Stress Questionnaire    Feeling of Stress: To some extent  Social Connections: Socially Isolated (12/01/2023)   Social Connection and Isolation Panel    Frequency of Communication with Friends and Family: More than three times a week    Frequency of Social Gatherings with Friends and Family: More than three times a week    Attends  Religious Services: Never    Active Member of Clubs or Organizations: No    Attends Banker Meetings: Never    Marital Status: Divorced  Catering manager Violence: Not At Risk (12/01/2023)   Humiliation, Afraid, Rape, and Kick questionnaire    Fear of Current or Ex-Partner: No    Emotionally Abused: No    Physically Abused: No    Sexually Abused: No     Outpatient Medications Prior to Visit  Medication Sig Dispense Refill   acetaminophen  (TYLENOL ) 325 MG tablet Take 2 tablets (650 mg total) by mouth 3 (three) times daily as needed for mild pain (pain score 1-3), moderate pain (pain score 4-6), fever or headache (or temp > 37.5 C (99.5 F)).     amLODipine  (NORVASC ) 10 MG tablet Take 1 tablet (10 mg total) by  mouth daily. Skip the dose if systolic BP less than 140 mmHg 90 tablet 2   aspirin  EC 81 MG tablet Take 1 tablet (81 mg total) by mouth daily. Swallow whole. 90 tablet 1   atorvastatin  (LIPITOR ) 80 MG tablet Take 1 tablet (80 mg total) by mouth at bedtime. 90 tablet 2   lisinopril  (ZESTRIL ) 10 MG tablet Take 1 tablet (10 mg total) by mouth daily. Skip the dose if systolic BP less than 140 mmHg 90 tablet 1   mupirocin  ointment (BACTROBAN ) 2 % Apply 1 Application topically 2 (two) times daily. (Patient not taking: Reported on 12/01/2023) 22 g 0   No facility-administered medications prior to visit.    Allergies  Allergen Reactions   Sulfa Antibiotics Itching   Sulfate    Penicillin G Rash    ROS Review of Systems Negative unless indicated in HPI.    Objective:    Physical Exam Constitutional:      Appearance: Normal appearance.  HENT:     Mouth/Throat:     Mouth: Mucous membranes are moist.  Eyes:     Conjunctiva/sclera: Conjunctivae normal.     Pupils: Pupils are equal, round, and reactive to light.  Cardiovascular:     Rate and Rhythm: Normal rate and regular rhythm.     Pulses: Normal pulses.     Heart sounds: Normal heart sounds.  Pulmonary:     Effort: Pulmonary effort is normal.     Breath sounds: Normal breath sounds.  Abdominal:     General: Bowel sounds are normal.     Palpations: Abdomen is soft.  Musculoskeletal:     Cervical back: Normal range of motion. No tenderness.  Skin:    General: Skin is warm.     Findings: No bruising.  Neurological:     General: No focal deficit present.     Mental Status: She is alert and oriented to person, place, and time. Mental status is at baseline.  Psychiatric:        Mood and Affect: Mood normal.        Behavior: Behavior normal.        Thought Content: Thought content normal.        Judgment: Judgment normal.     BP 133/78   Pulse 78   Temp 97.9 F (36.6 C)   Ht 4' 11 (1.499 m)   Wt 106 lb 3.2 oz (48.2 kg)    SpO2 96%   BMI 21.45 kg/m  Wt Readings from Last 3 Encounters:  02/04/24 106 lb 3.2 oz (48.2 kg)  01/31/24 106 lb 8 oz (48.3 kg)  12/01/23 115 lb (52.2 kg)  Health Maintenance  Topic Date Due   DTaP/Tdap/Td (1 - Tdap) Never done   Pneumococcal Vaccine: 50+ Years (1 of 2 - PCV) Never done   Zoster Vaccines- Shingrix (1 of 2) Never done   DEXA SCAN  Never done   COVID-19 Vaccine (1 - 2025-26 season) Never done   Influenza Vaccine  08/01/2024 (Originally 12/03/2023)   Medicare Annual Wellness (AWV)  11/30/2024   Meningococcal B Vaccine  Aged Out    There are no preventive care reminders to display for this patient.  Lab Results  Component Value Date   TSH 1.466 05/05/2023   Lab Results  Component Value Date   WBC 9.3 05/10/2023   HGB 15.3 (H) 05/10/2023   HCT 45.7 05/10/2023   MCV 92.1 05/10/2023   PLT 275.0 05/10/2023   Lab Results  Component Value Date   NA 140 02/04/2024   K 4.4 02/04/2024   CO2 23 02/04/2024   GLUCOSE 120 (H) 02/04/2024   BUN 21 02/04/2024   CREATININE 1.13 (H) 02/04/2024   BILITOT 0.4 02/04/2024   ALKPHOS 88 02/04/2024   AST 19 02/04/2024   ALT 13 02/04/2024   PROT 6.8 02/04/2024   ALBUMIN 4.3 02/04/2024   CALCIUM  10.0 02/04/2024   ANIONGAP 12 05/03/2023   EGFR 48 (L) 02/04/2024   GFR 49.69 (L) 09/21/2023   Lab Results  Component Value Date   CHOL 166 09/21/2023   Lab Results  Component Value Date   HDL 54.10 09/21/2023   Lab Results  Component Value Date   LDLCALC 88 09/21/2023   Lab Results  Component Value Date   TRIG 119.0 09/21/2023   Lab Results  Component Value Date   CHOLHDL 3 09/21/2023   Lab Results  Component Value Date   HGBA1C 5.8 (H) 05/04/2023      Assessment & Plan:  Vitamin D  deficiency Assessment & Plan: Last vitamin D  Lab Results  Component Value Date   VD25OH 18.2 (L) 02/04/2024  - Will start on prescription Vit D once a week for 12 weeks and will recheck at next visit.      Hypertension, unspecified type Assessment & Plan: Patient BP  Vitals:   02/04/24 1454 02/04/24 1504  BP: 112/74 133/78   -Advised pt to follow a low sodium and heart healthy diet. - Continue amlodipine  and lisinopril .    Orders: -     Comprehensive metabolic panel with GFR -     VITAMIN D  25 Hydroxy (Vit-D Deficiency, Fractures)  Mixed hyperlipidemia Assessment & Plan: Lab Results  Component Value Date   CHOL 166 09/21/2023   HDL 54.10 09/21/2023   LDLCALC 88 09/21/2023   TRIG 119.0 09/21/2023   CHOLHDL 3 09/21/2023  - Continue statin therapy for cardiovascular risk reduction. -Will continue to monitor   Squamous cell carcinoma of skin of right lower extremity Assessment & Plan: Squamous cell carcinoma of right lower extremity removal in August, 2025. -Followed by dermatology.   CKD stage 3a, GFR 45-59 ml/min (HCC)  Carotid stenosis, right Assessment & Plan: Recent Carotid duplex showed RICA 1-39%(mild narrowing) and LICA 40-59% (Moderate narrowing) on 01/31/24. -Followed by vein and vascular . -Continue antiplatelet therapy, mangemnt of CAD, HTN and hyperlipidemia.   Other orders -     Vitamin D  (Ergocalciferol ); Take 1 capsule (50,000 Units total) by mouth every 7 (seven) days.  Dispense: 12 capsule; Refill: 0    Follow-up: Return in about 4 months (around 06/06/2024), or if symptoms worsen or fail to improve.  Severus Brodzinski, NP

## 2024-02-05 ENCOUNTER — Encounter (INDEPENDENT_AMBULATORY_CARE_PROVIDER_SITE_OTHER): Payer: Self-pay | Admitting: Vascular Surgery

## 2024-02-05 LAB — COMPREHENSIVE METABOLIC PANEL WITH GFR
ALT: 13 IU/L (ref 0–32)
AST: 19 IU/L (ref 0–40)
Albumin: 4.3 g/dL (ref 3.7–4.7)
Alkaline Phosphatase: 88 IU/L (ref 48–129)
BUN/Creatinine Ratio: 19 (ref 12–28)
BUN: 21 mg/dL (ref 8–27)
Bilirubin Total: 0.4 mg/dL (ref 0.0–1.2)
CO2: 23 mmol/L (ref 20–29)
Calcium: 10 mg/dL (ref 8.7–10.3)
Chloride: 102 mmol/L (ref 96–106)
Creatinine, Ser: 1.13 mg/dL — ABNORMAL HIGH (ref 0.57–1.00)
Globulin, Total: 2.5 g/dL (ref 1.5–4.5)
Glucose: 120 mg/dL — ABNORMAL HIGH (ref 70–99)
Potassium: 4.4 mmol/L (ref 3.5–5.2)
Sodium: 140 mmol/L (ref 134–144)
Total Protein: 6.8 g/dL (ref 6.0–8.5)
eGFR: 48 mL/min/1.73 — ABNORMAL LOW (ref >59)

## 2024-02-05 LAB — VITAMIN D 25 HYDROXY (VIT D DEFICIENCY, FRACTURES): Vit D, 25-Hydroxy: 18.2 ng/mL — ABNORMAL LOW (ref 30.0–100.0)

## 2024-02-20 ENCOUNTER — Ambulatory Visit: Payer: Self-pay | Admitting: Nurse Practitioner

## 2024-02-20 DIAGNOSIS — C44722 Squamous cell carcinoma of skin of right lower limb, including hip: Secondary | ICD-10-CM | POA: Insufficient documentation

## 2024-02-20 MED ORDER — VITAMIN D (ERGOCALCIFEROL) 1.25 MG (50000 UNIT) PO CAPS: 50000.0000 [IU] | ORAL_CAPSULE | ORAL | 0 refills | Status: AC

## 2024-02-20 NOTE — Assessment & Plan Note (Signed)
 Last vitamin D  Lab Results  Component Value Date   VD25OH 18.2 (L) 02/04/2024  - Will start on prescription Vit D once a week for 12 weeks and will recheck at next visit.

## 2024-02-20 NOTE — Assessment & Plan Note (Signed)
 Patient BP  Vitals:   02/04/24 1454 02/04/24 1504  BP: 112/74 133/78   -Advised pt to follow a low sodium and heart healthy diet. - Continue amlodipine  and lisinopril .

## 2024-02-20 NOTE — Assessment & Plan Note (Signed)
 Lab Results  Component Value Date   CHOL 166 09/21/2023   HDL 54.10 09/21/2023   LDLCALC 88 09/21/2023   TRIG 119.0 09/21/2023   CHOLHDL 3 09/21/2023  - Continue statin therapy for cardiovascular risk reduction. -Will continue to monitor

## 2024-02-20 NOTE — Assessment & Plan Note (Signed)
 Squamous cell carcinoma of right lower extremity removal in August, 2025. -Followed by dermatology.

## 2024-02-20 NOTE — Assessment & Plan Note (Signed)
 Recent Carotid duplex showed RICA 1-39%(mild narrowing) and LICA 40-59% (Moderate narrowing) on 01/31/24. -Followed by vein and vascular . -Continue antiplatelet therapy, mangemnt of CAD, HTN and hyperlipidemia.

## 2024-02-20 NOTE — Progress Notes (Signed)
 Please inform pt: Vit D is low. I have sent 50,000 units of Vit D prescription to the pharmacy weekly for 12 weeks. After completing the prescription you can take take OTC 2000 units daily.  Kidneys are slow: Increase fluid intake and avoid NSAIDs. Will recheck at next visit.

## 2024-02-20 NOTE — Assessment & Plan Note (Signed)
 Alexa Ingram

## 2024-03-17 ENCOUNTER — Other Ambulatory Visit: Payer: Self-pay | Admitting: Nurse Practitioner

## 2024-03-17 DIAGNOSIS — I10 Essential (primary) hypertension: Secondary | ICD-10-CM

## 2024-03-17 DIAGNOSIS — E782 Mixed hyperlipidemia: Secondary | ICD-10-CM

## 2024-03-17 MED ORDER — LISINOPRIL 10 MG PO TABS: 10.0000 mg | ORAL_TABLET | Freq: Every day | ORAL | 1 refills | Status: AC

## 2024-03-17 MED ORDER — ATORVASTATIN CALCIUM 80 MG PO TABS: 80.0000 mg | ORAL_TABLET | Freq: Every day | ORAL | 2 refills | Status: AC

## 2024-03-17 NOTE — Telephone Encounter (Signed)
 Copied from CRM #[id number]. Topic: Clinical - Medication Refill >> Mar 17, 2024  8:56 AM Suzen RAMAN wrote: Medication: atorvastatin  (LIPITOR ) 80 MG tablet lisinopril  (ZESTRIL ) 10 MG tablet   Has the patient contacted their pharmacy? Yes   This is the patient's preferred pharmacy:  [id number] [place] - [address] AT [facility] OF [address] [facility] [place] [address] Phone: [phone] Fax: [phone]  Is this the correct pharmacy for this prescription? Yes If no, delete pharmacy and type the correct one.   Has the prescription been filled recently? No  Is the patient out of the medication? No  Has the patient been seen for an appointment in the last year OR does the patient have an upcoming appointment? Yes  Can we respond through MyChart? Yes  Agent: Please be advised that Rx refills may take up to 3 business days. We ask that you follow-up with your pharmacy.

## 2024-06-06 ENCOUNTER — Ambulatory Visit: Admitting: Nurse Practitioner

## 2024-12-05 ENCOUNTER — Ambulatory Visit

## 2025-01-29 ENCOUNTER — Encounter (INDEPENDENT_AMBULATORY_CARE_PROVIDER_SITE_OTHER)

## 2025-01-29 ENCOUNTER — Ambulatory Visit (INDEPENDENT_AMBULATORY_CARE_PROVIDER_SITE_OTHER): Admitting: Vascular Surgery
